# Patient Record
Sex: Male | Born: 1995 | Race: White | Hispanic: No | Marital: Single | Smoking: Never smoker
Health system: Southern US, Community
[De-identification: ages and names within clinical notes are randomized; demographics above are authoritative.]

## PROBLEM LIST (undated history)

## (undated) DIAGNOSIS — R030 Elevated blood-pressure reading, without diagnosis of hypertension: Secondary | ICD-10-CM

## (undated) DIAGNOSIS — R519 Headache, unspecified: Secondary | ICD-10-CM

## (undated) HISTORY — DX: Elevated blood-pressure reading, without diagnosis of hypertension: R03.0

## (undated) HISTORY — DX: Headache, unspecified: R51.9

---

## 2018-07-11 ENCOUNTER — Ambulatory Visit: Payer: Self-pay | Admitting: Primary Care

## 2018-09-09 ENCOUNTER — Ambulatory Visit: Payer: Self-pay | Admitting: Primary Care

## 2018-09-13 ENCOUNTER — Encounter: Payer: Self-pay | Admitting: Primary Care

## 2018-09-13 ENCOUNTER — Other Ambulatory Visit: Payer: Self-pay

## 2018-09-13 ENCOUNTER — Ambulatory Visit: Payer: BLUE CROSS/BLUE SHIELD | Admitting: Primary Care

## 2018-09-13 ENCOUNTER — Ambulatory Visit (INDEPENDENT_AMBULATORY_CARE_PROVIDER_SITE_OTHER)
Admission: RE | Admit: 2018-09-13 | Discharge: 2018-09-13 | Disposition: A | Discharge status: Home / Self Care | Payer: BLUE CROSS/BLUE SHIELD | Source: Ambulatory Visit | Attending: Primary Care | Admitting: Primary Care

## 2018-09-13 VITALS — BP 146/96 | HR 83 | Temp 98.2°F | Ht 70.25 in | Wt 233.5 lb

## 2018-09-13 DIAGNOSIS — R030 Elevated blood-pressure reading, without diagnosis of hypertension: Secondary | ICD-10-CM | POA: Diagnosis not present

## 2018-09-13 DIAGNOSIS — M79672 Pain in left foot: Secondary | ICD-10-CM

## 2018-09-13 DIAGNOSIS — R200 Anesthesia of skin: Secondary | ICD-10-CM

## 2018-09-13 DIAGNOSIS — M79673 Pain in unspecified foot: Secondary | ICD-10-CM | POA: Insufficient documentation

## 2018-09-13 DIAGNOSIS — R202 Paresthesia of skin: Secondary | ICD-10-CM

## 2018-09-13 LAB — COMPREHENSIVE METABOLIC PANEL WITH GFR
ALT: 11 U/L (ref 0–53)
AST: 18 U/L (ref 0–37)
Albumin: 4.5 g/dL (ref 3.5–5.2)
Alkaline Phosphatase: 52 U/L (ref 39–117)
BUN: 20 mg/dL (ref 6–23)
CO2: 27 meq/L (ref 19–32)
Calcium: 9.7 mg/dL (ref 8.4–10.5)
Chloride: 105 meq/L (ref 96–112)
Creatinine, Ser: 0.96 mg/dL (ref 0.40–1.50)
GFR: 98.46 mL/min
Glucose, Bld: 94 mg/dL (ref 70–99)
Potassium: 5.1 meq/L (ref 3.5–5.1)
Sodium: 141 meq/L (ref 135–145)
Total Bilirubin: 0.5 mg/dL (ref 0.2–1.2)
Total Protein: 7.4 g/dL (ref 6.0–8.3)

## 2018-09-13 LAB — URIC ACID: Uric Acid, Serum: 8.5 mg/dL — ABNORMAL HIGH (ref 4.0–7.8)

## 2018-09-13 LAB — VITAMIN B12: Vitamin B-12: 556 pg/mL (ref 211–911)

## 2018-09-13 LAB — CBC
HCT: 43.8 % (ref 39.0–52.0)
Hemoglobin: 15.5 g/dL (ref 13.0–17.0)
MCHC: 35.3 g/dL (ref 30.0–36.0)
MCV: 96.7 fl (ref 78.0–100.0)
Platelets: 286 K/uL (ref 150.0–400.0)
RBC: 4.53 Mil/uL (ref 4.22–5.81)
RDW: 12.6 % (ref 11.5–15.5)
WBC: 6 K/uL (ref 4.0–10.5)

## 2018-09-13 LAB — HEMOGLOBIN A1C: Hgb A1c MFr Bld: 5.1 % (ref 4.6–6.5)

## 2018-09-13 NOTE — Assessment & Plan Note (Signed)
Noted today, also about the same with repeat BP check. Strong family history of hypertension and diabetes.  Encouraged him to continue to work on weight loss, recommended regular exercise.  Will have him monitor BP at home and report readings at or above 135/90. We will plan to see him back for CPE in 2-3 months.

## 2018-09-13 NOTE — Patient Instructions (Signed)
Stop by the lab and xray prior to leaving today. I will notify you of your results once received.   Continue to monitor your blood pressure and notify me if you see readings at or above 135/90 consistently.  Start exercising. You should be getting 150 minutes of moderate intensity exercise weekly.  It's important to improve your diet by reducing consumption of fast food, fried food, processed snack foods, sugary drinks. Increase consumption of fresh vegetables and fruits, whole grains, water.  Ensure you are drinking 64 ounces of water daily.  Please schedule a physical with me in 3 months.  It was a pleasure to meet you today! Please don't hesitate to call or message me with any questions. Welcome to [facility]!

## 2018-09-13 NOTE — Assessment & Plan Note (Addendum)
Present for the last two months, does work on his feet for most of his work day. Numbness and tingling to left foot and great toe. Also with strong family history of diabetes with charcot foot and hypertension  Start with labs including A1C, CMP, B12, CBC.  Check plain films of left foot to rule out any other cause.  Suspect plantar fasciitis. Discussed home exercises to left foot for reduction in symptoms. Good ROM on exam. If labs and plain films unremarkable then consider podiatry evaluation.

## 2018-09-13 NOTE — Progress Notes (Signed)
Subjective:    Patient ID: Ronald Landry, male    DOB: 1996-04-20, 23 y.o.   MRN: [id number]  HPI  Ronald Landry is a 23 year old male who presents today to establish care and discuss the problems mentioned below. Will obtain/review records.  BP Readings from Last 3 Encounters:  09/13/18 (!) 146/96    1) Elevated Blood Pressure Reading: Strong family history of hypertension, hyperlipidemia, and diabetes. No prior diagnosis of hypertension. He endorses that he's ben under a lot of stress over the last one week with work.   He endorses a fair diet, limits fried food. He tries to make better choices, drinks mostly water. He endorses a weight loss of nearly 70 pounds after improving his lifestyle.   2) Foot Pain: Located to the left heel and medial side of foot down to the great toe. Daily numbness to the lateral and tip of left great toe. This began about 2 months ago. He works at [employer] and is on his feet for 7-8 hours at a time for 5-7 days weekly. He describes his discomfort as "tingling".He will be "unable to walk" sometimes after a long shift. He is wearing Brooks shoes with extra insoles. He's been taking Aleve most everyday with temporary improvement. He's been wearing a foot support brace for the last one month with some improvement.   He denies injury/trauma, erythema, bruising.   Review of Systems  Constitutional: Negative for unexpected weight change.  Respiratory: Negative for shortness of breath.   Cardiovascular: Negative for chest pain.  Musculoskeletal:       Acute left foot pain  Skin: Negative for color change.  Neurological: Positive for numbness. Negative for dizziness.       No past medical history on file.   Social History   Socioeconomic History  . Marital status: Single    Spouse name: Not on file  . Number of children: Not on file  . Years of education: Not on file  . Highest education level: Not on file  Occupational History  . Not on file  Social  Needs  . Financial resource strain: Not on file  . Food insecurity:    Worry: Not on file    Inability: Not on file  . Transportation needs:    Medical: Not on file    Non-medical: Not on file  Tobacco Use  . Smoking status: Never Smoker  . Smokeless tobacco: Never Used  Substance and Sexual Activity  . Alcohol use: Yes  . Drug use: Not on file  . Sexual activity: Not on file  Lifestyle  . Physical activity:    Days per week: Not on file    Minutes per session: Not on file  . Stress: Not on file  Relationships  . Social connections:    Talks on phone: Not on file    Gets together: Not on file    Attends religious service: Not on file    Active member of club or organization: Not on file    Attends meetings of clubs or organizations: Not on file    Relationship status: Not on file  . Intimate partner violence:    Fear of current or ex partner: Not on file    Emotionally abused: Not on file    Physically abused: Not on file    Forced sexual activity: Not on file  Other Topics Concern  . Not on file  Social History Narrative  . Not on file  Family History  Problem Relation Age of Onset  . Hyperlipidemia Mother   . Hypertension Mother   . Diabetes Father   . Hyperlipidemia Father   . Hypertension Father   . Diabetes Paternal Grandmother   . Hypertension Paternal Grandmother   . Diabetes Paternal Grandfather   . Hypertension Paternal Grandfather     No Known Allergies  No current outpatient medications on file prior to visit.   No current facility-administered medications on file prior to visit.     BP (!) 146/96   Pulse 83   Temp 98.2 F (36.8 C) (Oral)   Ht 5' 10.25" (1.784 m)   Wt 233 lb 8 oz (105.9 kg)   SpO2 98%   BMI 33.27 kg/m    Objective:   Physical Exam  Constitutional: He appears well-nourished.  Neck: Neck supple.  Cardiovascular: Normal rate and regular rhythm.  Pulses:      Dorsalis pedis pulses are 2+ on the left side.        Posterior tibial pulses are 2+ on the left side.  Respiratory: Effort normal and breath sounds normal.  Musculoskeletal:     Left foot: Normal range of motion. No tenderness, bony tenderness or swelling.       Feet:  Skin: Skin is warm and dry. No erythema.  Psychiatric: He has a normal mood and affect.           Assessment & Plan:

## 2018-09-14 ENCOUNTER — Encounter: Payer: Self-pay | Admitting: *Deleted

## 2018-09-14 ENCOUNTER — Other Ambulatory Visit: Payer: Self-pay | Admitting: Primary Care

## 2018-09-14 DIAGNOSIS — M109 Gout, unspecified: Secondary | ICD-10-CM

## 2018-09-14 MED ORDER — PREDNISONE 20 MG PO TABS: ORAL_TABLET | ORAL | 0 refills | Status: DC

## 2018-12-14 ENCOUNTER — Encounter: Payer: BLUE CROSS/BLUE SHIELD | Admitting: Primary Care

## 2019-04-25 ENCOUNTER — Encounter: Payer: Self-pay | Admitting: Primary Care

## 2019-04-25 ENCOUNTER — Ambulatory Visit: Payer: BC Managed Care – PPO | Admitting: Primary Care

## 2019-04-25 ENCOUNTER — Ambulatory Visit (INDEPENDENT_AMBULATORY_CARE_PROVIDER_SITE_OTHER)
Admission: RE | Admit: 2019-04-25 | Discharge: 2019-04-25 | Disposition: A | Discharge status: Home / Self Care | Payer: BC Managed Care – PPO | Source: Ambulatory Visit | Attending: Primary Care | Admitting: Primary Care

## 2019-04-25 ENCOUNTER — Other Ambulatory Visit: Payer: Self-pay

## 2019-04-25 VITALS — BP 136/86 | HR 104 | Temp 96.6°F | Ht 70.25 in | Wt 208.2 lb

## 2019-04-25 DIAGNOSIS — G8929 Other chronic pain: Secondary | ICD-10-CM | POA: Diagnosis not present

## 2019-04-25 DIAGNOSIS — M79672 Pain in left foot: Secondary | ICD-10-CM

## 2019-04-25 DIAGNOSIS — Z23 Encounter for immunization: Secondary | ICD-10-CM

## 2019-04-25 LAB — CBC WITH DIFFERENTIAL/PLATELET
Basophils Absolute: 0.1 10*3/uL (ref 0.0–0.1)
Basophils Relative: 1.1 % (ref 0.0–3.0)
Eosinophils Absolute: 0.2 10*3/uL (ref 0.0–0.7)
Eosinophils Relative: 3.6 % (ref 0.0–5.0)
HCT: 44.3 % (ref 39.0–52.0)
Hemoglobin: 15.1 g/dL (ref 13.0–17.0)
Lymphocytes Relative: 28.8 % (ref 12.0–46.0)
Lymphs Abs: 1.8 10*3/uL (ref 0.7–4.0)
MCHC: 34.2 g/dL (ref 30.0–36.0)
MCV: 96.6 fl (ref 78.0–100.0)
Monocytes Absolute: 0.4 10*3/uL (ref 0.1–1.0)
Monocytes Relative: 5.9 % (ref 3.0–12.0)
Neutro Abs: 3.7 10*3/uL (ref 1.4–7.7)
Neutrophils Relative %: 60.6 % (ref 43.0–77.0)
Platelets: 315 10*3/uL (ref 150.0–400.0)
RBC: 4.59 Mil/uL (ref 4.22–5.81)
RDW: 12.7 % (ref 11.5–15.5)
WBC: 6.1 10*3/uL (ref 4.0–10.5)

## 2019-04-25 LAB — URIC ACID: Uric Acid, Serum: 7.2 mg/dL (ref 4.0–7.8)

## 2019-04-25 NOTE — Assessment & Plan Note (Signed)
Little improvement after prednisone treatment last year, continued discomfort with weight bearing.   Repeat labs and plain films. Discussed to continue to wear comfortable shoes, add in insoles.   Referral placed to podiatry for evaluation given chronic symptoms.

## 2019-04-25 NOTE — Progress Notes (Signed)
Subjective:    Patient ID: Ronald Landry, male    DOB: 1995-10-29, 24 y.o.   MRN: [id number]  HPI  Mr. Ronald Landry is a 24 year old male with a history of acute foot pain who presents today with a chief complaint of foot pain.  He was initially evaluated for this in May 2020 for complaints of left heel and medial foot pain down to the great toe. Xray completed which showed early spurring at 1st MTP joint with maintained spaces. Labs showed elevated Uric Acid level so he was treated with prednisone course. Other labs grossly unremarkable.   BP Readings from Last 3 Encounters:  04/25/19 136/86  09/13/18 (!) 146/96   Today he endorses left medial foot pain beginning at the arch and radiating to the left medial ankle and occasional radiation up to left knee. He works at [employer] and is on his feet for 10 hours, four to five days weekly. After treatment in May 2020 he had some improvement but without resolve.   He endorses wearing more comfortable shoes at work over the last week which has helped some. He's tried adding extra insoles to his shoes in the past. He's taking Aleve, Motrin, Tylenol without improvement.   Review of Systems  Constitutional: Negative for fever.  Musculoskeletal: Positive for arthralgias and myalgias.  Skin: Negative for color change.       Past Medical History:  Diagnosis Date  . Elevated blood pressure reading   . Frequent headaches      Social History   Socioeconomic History  . Marital status: Single    Spouse name: Not on file  . Number of children: Not on file  . Years of education: Not on file  . Highest education level: Not on file  Occupational History  . Not on file  Tobacco Use  . Smoking status: Never Smoker  . Smokeless tobacco: Never Used  Substance and Sexual Activity  . Alcohol use: Yes  . Drug use: Not on file  . Sexual activity: Not on file  Other Topics Concern  . Not on file  Social History Narrative  . Not on file   Social  Determinants of Health   Financial Resource Strain:   . Difficulty of Paying Living Expenses: Not on file  Food Insecurity:   . Worried About Programme researcher, broadcasting/film/video in the Last Year: Not on file  . Ran Out of Food in the Last Year: Not on file  Transportation Needs:   . Lack of Transportation (Medical): Not on file  . Lack of Transportation (Non-Medical): Not on file  Physical Activity:   . Days of Exercise per Week: Not on file  . Minutes of Exercise per Session: Not on file  Stress:   . Feeling of Stress : Not on file  Social Connections:   . Frequency of Communication with Friends and Family: Not on file  . Frequency of Social Gatherings with Friends and Family: Not on file  . Attends Religious Services: Not on file  . Active Member of Clubs or Organizations: Not on file  . Attends Banker Meetings: Not on file  . Marital Status: Not on file  Intimate Partner Violence:   . Fear of Current or Ex-Partner: Not on file  . Emotionally Abused: Not on file  . Physically Abused: Not on file  . Sexually Abused: Not on file    No past surgical history on file.  Family History  Problem Relation Age of  Onset  . Hyperlipidemia Mother   . Hypertension Mother   . Diabetes Father   . Hyperlipidemia Father   . Hypertension Father   . Diabetes Paternal Grandmother   . Hypertension Paternal Grandmother   . Diabetes Paternal Grandfather   . Hypertension Paternal Grandfather     No Known Allergies  No current outpatient medications on file prior to visit.   No current facility-administered medications on file prior to visit.    BP 136/86   Pulse (!) 104   Temp (!) 96.6 F (35.9 C) (Temporal)   Ht 5' 10.25" (1.784 m)   Wt 208 lb 4 oz (94.5 kg)   SpO2 98%   BMI 29.67 kg/m    Objective:   Physical Exam  Constitutional: He appears well-nourished.  Respiratory: Effort normal.  Musculoskeletal:     Left foot: Normal range of motion. No deformity, tenderness or  bony tenderness.     Comments: Flat feet with minimal arch  Skin: Skin is warm and dry. No erythema.           Assessment & Plan:

## 2019-04-25 NOTE — Addendum Note (Signed)
Addended by: Tawnya Crook on: 04/25/2019 12:38 PM   Modules accepted: Orders

## 2019-04-25 NOTE — Patient Instructions (Addendum)
Stop by the lab and xray prior to leaving today. I will notify you of your results once received.   You will be contacted regarding your referral to podiatry.  Please let us know if you have not been contacted within two weeks.   Continue to wear comfortable, supportive shoes. Add in additional insoles for support.   It was a pleasure to see you today!

## 2019-05-02 ENCOUNTER — Ambulatory Visit (INDEPENDENT_AMBULATORY_CARE_PROVIDER_SITE_OTHER): Payer: BC Managed Care – PPO | Admitting: Podiatry

## 2019-05-02 ENCOUNTER — Other Ambulatory Visit: Payer: Self-pay

## 2019-05-02 ENCOUNTER — Ambulatory Visit: Payer: BC Managed Care – PPO

## 2019-05-02 ENCOUNTER — Other Ambulatory Visit: Payer: Self-pay | Admitting: Podiatry

## 2019-05-02 ENCOUNTER — Ambulatory Visit (INDEPENDENT_AMBULATORY_CARE_PROVIDER_SITE_OTHER): Payer: BC Managed Care – PPO

## 2019-05-02 DIAGNOSIS — M79672 Pain in left foot: Secondary | ICD-10-CM

## 2019-05-02 DIAGNOSIS — M722 Plantar fascial fibromatosis: Secondary | ICD-10-CM

## 2019-05-04 ENCOUNTER — Encounter: Payer: Self-pay | Admitting: Podiatry

## 2019-05-04 NOTE — Progress Notes (Signed)
  Subjective:  Patient ID: Ronald Landry, male    DOB: Aug 13, 1995,  MRN: [id number]  Chief Complaint  Patient presents with  . Foot Pain    pt is here for left foot pain, located on the medial side of the left foot, pain has been going on for about 2 years, pt states that pain is elevated when he is at work, pt states that the pain at times can be on a 10 out of 10 scale    24 y.o. male presents with the above complaint.  Patient presents with left heel pain.  Patient states the pain started about 2 years ago and has progressively gotten worse.  Patient states the pain is right in the arch of her foot.  He states he does have a history of gout and is managed with medication.  Pain is elevated when walking.  Patient has tried Epson salt but has not helped.  Patient also works as she eats and states that he walks around a lot that prevents this from properly healing.  Patient states that his pain is 10 out of 10 today.  He denies any other acute complaints.  He denies seeing anyone else for this.   Review of Systems: Negative except as noted in the HPI. Denies N/V/F/Ch.  Past Medical History:  Diagnosis Date  . Elevated blood pressure reading   . Frequent headaches    No current outpatient medications on file.  Social History   Tobacco Use  Smoking Status Never Smoker  Smokeless Tobacco Never Used    No Known Allergies Objective:  There were no vitals filed for this visit. There is no height or weight on file to calculate BMI. Constitutional Well developed. Well nourished.  Vascular Dorsalis pedis pulses palpable bilaterally. Posterior tibial pulses palpable bilaterally. Capillary refill normal to all digits.  No cyanosis or clubbing noted. Pedal hair growth normal.  Neurologic Normal speech. Oriented to person, place, and time. Epicritic sensation to light touch grossly present bilaterally.  Dermatologic Nails well groomed and normal in appearance. No open wounds. No skin  lesions.  Orthopedic: Normal joint ROM without pain or crepitus bilaterally. No visible deformities. No pain on palpation to the calcaneal tuber.  However pain on palpation to the central band of the plantar fascia located centrally as opposed to to the heel. No pain with calcaneal squeeze left. Ankle ROM full range of motion bilaterally. Silfverskiold Test: negative bilaterally.   Radiographs: Taken and reviewed. No acute fractures or dislocations. No evidence of stress fracture.  Plantar heel spur absent. Posterior heel spur absent.  Assessment:   1. Foot pain, left   2. Plantar fasciitis of left foot    Plan:  Patient was evaluated and treated and all questions answered.  Plantar Fasciitis, left central band located centrally - XR reviewed as above.  - Educated on icing and stretching. Instructions given.  - Injection delivered to the plantar fascia as below. - DME: Plantar Fascial Brace - Pharmacologic management: None  Procedure: Injection Tendon/Ligament Location: Left plantar fascia at the glabrous junction; medial approach. Skin Prep: alcohol Injectate: 0.5 cc 0.5% marcaine plain, 0.5 cc of 1% Lidocaine, 0.5 cc kenalog 10. Disposition: Patient tolerated procedure well. Injection site dressed with a band-aid.  Return in about 4 weeks (around 05/30/2019).

## 2019-05-30 ENCOUNTER — Ambulatory Visit: Payer: BC Managed Care – PPO | Admitting: Podiatry

## 2019-06-12 ENCOUNTER — Ambulatory Visit: Payer: BC Managed Care – PPO | Admitting: Podiatry

## 2019-06-13 ENCOUNTER — Ambulatory Visit: Payer: BC Managed Care – PPO | Admitting: Podiatry

## 2019-06-15 DIAGNOSIS — Z03818 Encounter for observation for suspected exposure to other biological agents ruled out: Secondary | ICD-10-CM | POA: Diagnosis not present

## 2019-06-15 DIAGNOSIS — Z20828 Contact with and (suspected) exposure to other viral communicable diseases: Secondary | ICD-10-CM | POA: Diagnosis not present

## 2019-06-20 ENCOUNTER — Other Ambulatory Visit: Payer: Self-pay

## 2019-06-20 ENCOUNTER — Ambulatory Visit: Payer: BC Managed Care – PPO | Admitting: Podiatry

## 2019-06-20 DIAGNOSIS — M722 Plantar fascial fibromatosis: Secondary | ICD-10-CM

## 2019-06-20 DIAGNOSIS — M79672 Pain in left foot: Secondary | ICD-10-CM

## 2019-06-20 DIAGNOSIS — M2141 Flat foot [pes planus] (acquired), right foot: Secondary | ICD-10-CM | POA: Diagnosis not present

## 2019-06-20 DIAGNOSIS — M2142 Flat foot [pes planus] (acquired), left foot: Secondary | ICD-10-CM | POA: Diagnosis not present

## 2019-06-21 ENCOUNTER — Encounter: Payer: Self-pay | Admitting: Podiatry

## 2019-06-21 NOTE — Progress Notes (Signed)
  Subjective:  Patient ID: Ronald Landry, male    DOB: 12-04-1995,  MRN: [id number]  Chief Complaint  Patient presents with  . Plantar Fasciitis    pt is here for a 4 week plantar fasciitis f/u, pt states that he is feeling better butis still in pain, since the last time he was here, pt puts pain scale as a 6 out of 10.    24 y.o. male presents with the above complaint.  Patient presents with a follow-up of left plantar fasciitis.  Patient states is getting better.  The injection helped.  The bracing also helped.  He states his been doing stretching exercises which has also been helping.  His pain scale is now 6 out of 10.  The pain is decreasing from the first time he had something.  He denies any other acute complaints.  He has also made shoe gear modification as well.   Review of Systems: Negative except as noted in the HPI. Denies N/V/F/Ch.  Past Medical History:  Diagnosis Date  . Elevated blood pressure reading   . Frequent headaches    No current outpatient medications on file.  Social History   Tobacco Use  Smoking Status Never Smoker  Smokeless Tobacco Never Used    No Known Allergies Objective:  There were no vitals filed for this visit. There is no height or weight on file to calculate BMI. Constitutional Well developed. Well nourished.  Vascular Dorsalis pedis pulses palpable bilaterally. Posterior tibial pulses palpable bilaterally. Capillary refill normal to all digits.  No cyanosis or clubbing noted. Pedal hair growth normal.  Neurologic Normal speech. Oriented to person, place, and time. Epicritic sensation to light touch grossly present bilaterally.  Dermatologic Nails well groomed and normal in appearance. No open wounds. No skin lesions.  Orthopedic: Normal joint ROM without pain or crepitus bilaterally. No visible deformities. No pain on palpation to the calcaneal tuber.  However pain on palpation to the central band of the plantar fascia located  centrally as opposed to to the heel. No pain with calcaneal squeeze left. Ankle ROM full range of motion bilaterally. Silfverskiold Test: negative bilaterally.   Radiographs: None  Assessment:   No diagnosis found. Plan:  Patient was evaluated and treated and all questions answered.  Plantar Fasciitis, left central band located centrally - XR reviewed as above.  - Educated on icing and stretching. Instructions given.  - Injection delivered to the plantar fascia as below. - DME: Night splint - Pharmacologic management: None  Pes planus semiflexible -I explained to the patient the etiology of pes planus with this relationship with a plantar fasciitis.  I believe patient will benefit from custom-made orthotics to help support the arches of the foot as well as control the hindfoot motion and therefore take the stress off of the plantar fascia.  Patient will be scheduled to see rec/bed for custom-made orthotics.  Procedure: Injection Tendon/Ligament Location: Left plantar fascia at the glabrous junction; medial approach. Skin Prep: alcohol Injectate: 0.5 cc 0.5% marcaine plain, 0.5 cc of 1% Lidocaine, 0.5 cc kenalog 10. Disposition: Patient tolerated procedure well. Injection site dressed with a band-aid.  No follow-ups on file.

## 2019-07-05 ENCOUNTER — Other Ambulatory Visit: Payer: BC Managed Care – PPO | Admitting: Orthotics

## 2019-07-11 ENCOUNTER — Other Ambulatory Visit: Payer: BC Managed Care – PPO | Admitting: Orthotics

## 2019-07-18 ENCOUNTER — Ambulatory Visit: Payer: BC Managed Care – PPO | Admitting: Podiatry

## 2019-07-18 ENCOUNTER — Other Ambulatory Visit: Payer: Self-pay

## 2019-07-18 DIAGNOSIS — M2141 Flat foot [pes planus] (acquired), right foot: Secondary | ICD-10-CM

## 2019-07-18 DIAGNOSIS — M2142 Flat foot [pes planus] (acquired), left foot: Secondary | ICD-10-CM

## 2019-07-18 DIAGNOSIS — M79672 Pain in left foot: Secondary | ICD-10-CM | POA: Diagnosis not present

## 2019-07-18 DIAGNOSIS — M722 Plantar fascial fibromatosis: Secondary | ICD-10-CM

## 2019-07-19 ENCOUNTER — Encounter: Payer: Self-pay | Admitting: Podiatry

## 2019-07-19 NOTE — Progress Notes (Signed)
  Subjective:  Patient ID: Ronald Landry, male    DOB: September 27, 1995,  MRN: [id number]  Chief Complaint  Patient presents with  . Plantar Fasciitis    pt is here for a f/u of plantar fasciitis of the left foot. Pt states that he is feeling a lot better. Pt also states that he feels no pain when wearing the brace. Pt has no other comments or concerns.    24 y.o. male presents with the above complaint.  Patient presents with a follow-up of left plantar fasciitis.  Patient states the injection and bracing tremendously helped.  He does not have pain anymore more.  He states he does not feel when wearing the brace.  He would like to discuss getting orthotics and more of long-term management.  Overall he is pain has completely resolved.   Review of Systems: Negative except as noted in the HPI. Denies N/V/F/Ch.  Past Medical History:  Diagnosis Date  . Elevated blood pressure reading   . Frequent headaches    No current outpatient medications on file.  Social History   Tobacco Use  Smoking Status Never Smoker  Smokeless Tobacco Never Used    No Known Allergies Objective:  There were no vitals filed for this visit. There is no height or weight on file to calculate BMI. Constitutional Well developed. Well nourished.  Vascular Dorsalis pedis pulses palpable bilaterally. Posterior tibial pulses palpable bilaterally. Capillary refill normal to all digits.  No cyanosis or clubbing noted. Pedal hair growth normal.  Neurologic Normal speech. Oriented to person, place, and time. Epicritic sensation to light touch grossly present bilaterally.  Dermatologic Nails well groomed and normal in appearance. No open wounds. No skin lesions.  Orthopedic: Normal joint ROM without pain or crepitus bilaterally. No visible deformities. No pain on palpation to the calcaneal tuber.   No pain with calcaneal squeeze left. Ankle ROM full range of motion bilaterally. Silfverskiold Test: negative  bilaterally.   Radiographs: None  Assessment:   1. Plantar fasciitis of left foot   2. Foot pain, left   3. Pes planus of both feet    Plan:  Patient was evaluated and treated and all questions answered.  Plantar Fasciitis, left central band located centrally -Resolved.  Patient has been wearing his plantar fascial brace.  He has also been wearing his night brace as well which is helping a little bit. -No further injections are indicated has as his pain completely resolved. -I discussed with the patient on long-term management of plantar fasciitis which includes stretching exercises as well as custom-made orthotics.  Patient will plan to get custom-made orthotics. -He is officially discharged from my care.  I have asked the patient that if any foot and ankle issues arise in the future come back and see me.  Patient states understanding  Pes planus semiflexible -I explained to the patient the etiology of pes planus with this relationship with a plantar fasciitis.  I believe patient will benefit from custom-made orthotics to help support the arches of the foot as well as control the hindfoot motion and therefore take the stress off of the plantar fascia.  Patient will be scheduled to see rec/bed for custom-made orthotics.   No follow-ups on file.

## 2019-07-20 ENCOUNTER — Other Ambulatory Visit: Payer: BC Managed Care – PPO | Admitting: Orthotics

## 2019-07-31 DIAGNOSIS — Z20828 Contact with and (suspected) exposure to other viral communicable diseases: Secondary | ICD-10-CM | POA: Diagnosis not present

## 2019-07-31 DIAGNOSIS — U071 COVID-19: Secondary | ICD-10-CM | POA: Diagnosis not present

## 2019-08-08 ENCOUNTER — Ambulatory Visit (INDEPENDENT_AMBULATORY_CARE_PROVIDER_SITE_OTHER): Payer: BC Managed Care – PPO | Admitting: Orthotics

## 2019-08-08 ENCOUNTER — Other Ambulatory Visit: Payer: Self-pay

## 2019-08-08 DIAGNOSIS — M722 Plantar fascial fibromatosis: Secondary | ICD-10-CM

## 2019-08-08 DIAGNOSIS — M2141 Flat foot [pes planus] (acquired), right foot: Secondary | ICD-10-CM

## 2019-08-08 DIAGNOSIS — M2142 Flat foot [pes planus] (acquired), left foot: Secondary | ICD-10-CM

## 2019-08-08 DIAGNOSIS — M79672 Pain in left foot: Secondary | ICD-10-CM

## 2019-08-29 ENCOUNTER — Ambulatory Visit: Payer: BC Managed Care – PPO | Admitting: Orthotics

## 2019-08-29 ENCOUNTER — Other Ambulatory Visit: Payer: Self-pay

## 2019-08-29 DIAGNOSIS — M722 Plantar fascial fibromatosis: Secondary | ICD-10-CM

## 2019-08-29 NOTE — Progress Notes (Signed)
Patient came in today to pick up custom made foot orthotics.  The goals were accomplished and the patient reported no dissatisfaction with said orthotics.  Patient was advised of breakin period and how to report any issues. 

## 2020-07-04 ENCOUNTER — Encounter: Payer: Self-pay | Admitting: Primary Care

## 2020-07-04 ENCOUNTER — Other Ambulatory Visit: Payer: Self-pay

## 2020-07-04 ENCOUNTER — Ambulatory Visit: Payer: BC Managed Care – PPO | Admitting: Primary Care

## 2020-07-04 VITALS — BP 134/82 | HR 118 | Temp 98.6°F | Ht 70.25 in | Wt 193.0 lb

## 2020-07-04 DIAGNOSIS — M79605 Pain in left leg: Secondary | ICD-10-CM

## 2020-07-04 MED ORDER — CYCLOBENZAPRINE HCL 5 MG PO TABS: 5.0000 mg | ORAL_TABLET | Freq: Every evening | ORAL | 0 refills | Status: DC | PRN

## 2020-07-04 MED ORDER — PREDNISONE 20 MG PO TABS: ORAL_TABLET | ORAL | 0 refills | Status: DC

## 2020-07-04 MED ORDER — METHYLPREDNISOLONE ACETATE 80 MG/ML IJ SUSP
80.0000 mg | Freq: Once | INTRAMUSCULAR | Status: AC
  Administered 2020-07-04: 80 mg via INTRAMUSCULAR

## 2020-07-04 NOTE — Patient Instructions (Signed)
Start prednisone tablets tomorrow morning. Take two tablets my mouth once daily for four days, then one tablet once daily for four days.   You may take the cyclobenzaprine 5 mg tablets twice daily as needed for muscle spasms, be careful as this may cause drowsiness. You may want to start at bedtime.   Try to stretch the leg muscle when able, this will prevent further stiffness.   It was a pleasure to see you today!

## 2020-07-04 NOTE — Progress Notes (Signed)
Subjective:    Patient ID: Ronald Landry, male    DOB: [birth year], 25 y.o.   MRN: [id number]  HPI  Ronald Landry is a very pleasant 25 y.o. male who presents today with a chief complaint of lower extremity pain.  His pain is located to the left lower buttocks, anterior and posterior upper portion of his left lower extremity, proximal to the knee.   His pain began about 2 months ago, worse over the last 2 weeks. He describes his pain as tightness, "feels like someone is grasping". He does notice tingling at times. Pain occurs at rest, when trying to sleep at night, laying down, walking.   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 He denies known injury/trauma, lower back pain, right lower extremity pain. 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    Review of Systems  Musculoskeletal: Positive for arthralgias and myalgias. Negative for back pain.  Neurological: Positive for numbness. Negative for weakness.         Past Medical History:  Diagnosis Date  . Elevated blood pressure reading   . Frequent headaches     Social History   Socioeconomic History  . Marital status: Single    Spouse name: Not on file  . Number of children: Not on file  . Years of education: Not on file  . Highest education level: Not on file  Occupational History  . Not on file  Tobacco Use  . Smoking status: Never Smoker  . Smokeless tobacco: Never Used  Substance and Sexual Activity  . Alcohol use: Yes  . Drug use: Not on file  . Sexual activity: Not on file  Other Topics Concern  . Not on file  Social History Narrative  . Not on file   Social Determinants of Health   Financial Resource Strain: Not on file  Food Insecurity: Not on file  Transportation Needs: Not on file  Physical Activity: Not on file  Stress: Not on file  Social Connections: Not on file  Intimate Partner  Violence: Not on file    History reviewed. No pertinent surgical history.  Family History  Problem Relation Age of Onset  . Hyperlipidemia Mother   . Hypertension Mother   . Diabetes Father   . Hyperlipidemia Father   . Hypertension Father   . Diabetes Paternal Grandmother   . Hypertension Paternal Grandmother   . Diabetes Paternal Grandfather   . Hypertension Paternal Grandfather     No Known Allergies  No current outpatient medications on file prior to visit.   No current facility-administered medications on file prior to visit.    BP 134/82   Pulse (!) 118   Temp 98.6 F (37 C) (Temporal)   Ht 5' 10.25" (1.784 m)   Wt 193 lb (87.5 kg)   SpO2 98%   BMI 27.50 kg/m  Objective:   Physical Exam Musculoskeletal:     Lumbar back: No bony tenderness. Normal range of motion.       Back:     Comments: Unable to straighten left lower extremity when in a seated position on exam table due to pain. Pain to left buttock and lower extremity when laying supine on exam table. Difficulty getting up and down from exam table.  5/5 strength to bilateral lower extremities when sitting on exam tablet.   Skin:    General: Skin is warm and dry.  Neurological:     Mental Status: He is alert.           Assessment & Plan:      This visit occurred during the SARS-CoV-2 public health emergency.  Safety protocols were in place, including screening questions prior to the visit, additional usage of staff PPE, and extensive cleaning of exam room while observing appropriate contact time as indicated for disinfecting solutions.

## 2020-07-04 NOTE — Assessment & Plan Note (Signed)
Could be piriformis syndrome with sciatica. He does appear uncomfortable on exam today, no alarm signs.   Start with IM Depo Medrol 80 mg today. Rx for prednisone 20 mg course sent to pharmacy.  Rx for flexeril 5 mg sent to pharmacy.   Discussed stretching, walking. He will update early next week if no improvement. Consider Physical therapy at that point.   Work note provided for [employer] and tomorrow.

## 2020-07-30 ENCOUNTER — Encounter: Payer: Self-pay | Admitting: Family Medicine

## 2020-07-30 ENCOUNTER — Telehealth: Payer: BC Managed Care – PPO | Admitting: Family Medicine

## 2020-07-30 VITALS — Temp 102.0°F | Wt 193.0 lb

## 2020-07-30 DIAGNOSIS — J029 Acute pharyngitis, unspecified: Secondary | ICD-10-CM | POA: Diagnosis not present

## 2020-07-30 DIAGNOSIS — M79605 Pain in left leg: Secondary | ICD-10-CM | POA: Diagnosis not present

## 2020-07-30 MED ORDER — CEPHALEXIN 500 MG PO CAPS: 500.0000 mg | ORAL_CAPSULE | Freq: Three times a day (TID) | ORAL | 0 refills | Status: AC

## 2020-07-30 NOTE — Progress Notes (Signed)
Subjective:    Patient ID: Ronald Landry, male    DOB: 08/22/1995, 25 y.o.   MRN: [id number]  HPI Virtual Visit via Video Note  I connected with the patient on 07/30/20 at  2:15 PM EDT by a video enabled telemedicine application and verified that I am speaking with the correct person using two identifiers.  Location patient: home Location provider:work or home office Persons participating in the virtual visit: patient, provider  I discussed the limitations of evaluation and management by telemedicine and the availability of in person appointments. The patient expressed understanding and agreed to proceed.   HPI: Here for 4 days of what he thinks is a "strep throat". He got these a lot when he was younger. He started off with a ST that got progressively worse over 48 hours. He also had a fever to 102 degrees, but this has settled down to 99.6 degrees today. He feels lightheaded and nauseated, but he has not vomited. No body aches or diarrhea. No cough or SOB. He tested negative for the Covid virus yesterday at home. He feels some relief by taking Ibuprofen. He is drinking plenty of fluids. Today the ST has localized more to the left side. He also asks about pain in the left buttock and left upper leg. This started 3 months ago. No hx of trauma. He saw her PCP, Vernona Rieger NP, on 07-04-20 for this and she treated him with Prednisone and Flexeril. This has not helped much.    ROS: See pertinent positives and negatives per HPI.  Past Medical History:  Diagnosis Date  . Elevated blood pressure reading   . Frequent headaches     History reviewed. No pertinent surgical history.  Family History  Problem Relation Age of Onset  . Hyperlipidemia Mother   . Hypertension Mother   . Diabetes Father   . Hyperlipidemia Father   . Hypertension Father   . Diabetes Paternal Grandmother   . Hypertension Paternal Grandmother   . Diabetes Paternal Grandfather   . Hypertension Paternal  Grandfather      Current Outpatient Medications:  .  cephALEXin (KEFLEX) 500 MG capsule, Take 1 capsule (500 mg total) by mouth 3 (three) times daily for 10 days., Disp: 30 capsule, Rfl: 0 .  cyclobenzaprine (FLEXERIL) 5 MG tablet, Take 1 tablet (5 mg total) by mouth at bedtime as needed for muscle spasms., Disp: 14 tablet, Rfl: 0 .  predniSONE (DELTASONE) 20 MG tablet, Take 2 tablets by mouth once daily for four days then 1 tablet daily for four days., Disp: 12 tablet, Rfl: 0  EXAM:  VITALS per patient if applicable:  GENERAL: alert, oriented, appears well and in no acute distress  HEENT: atraumatic, conjunttiva clear, no obvious abnormalities on inspection of external nose and ears  NECK: normal movements of the head and neck  LUNGS: on inspection no signs of respiratory distress, breathing rate appears normal, no obvious gross SOB, gasping or wheezing  CV: no obvious cyanosis  MS: moves all visible extremities without noticeable abnormality  PSYCH/NEURO: pleasant and cooperative, no obvious depression or anxiety, speech and thought processing grossly intact  ASSESSMENT AND PLAN: He has a pharyngitis and possibly a tonsillitis. We will treat this with Keflex for 10 days. Continue the fluids and Ibuprofen as needed. As for the leg pain, I advised him to follow up with Vernona Rieger.  Gershon Crane, MD  Discussed the following assessment and plan:  No diagnosis found.     I discussed  the assessment and treatment plan with the patient. The patient was provided an opportunity to ask questions and all were answered. The patient agreed with the plan and demonstrated an understanding of the instructions.   The patient was advised to call back or seek an in-person evaluation if the symptoms worsen or if the condition fails to improve as anticipated.     Review of Systems     Objective:   Physical Exam        Assessment & Plan:

## 2020-07-31 ENCOUNTER — Telehealth: Payer: Self-pay | Admitting: Primary Care

## 2020-07-31 NOTE — Telephone Encounter (Signed)
The patient is needing a work note for 07/31/2020 or when you suggest for him to go back to work. He is still running a fever of 100.2 today.  His job won't let him come back to work until he is fever free for 24-48 hours.

## 2020-07-31 NOTE — Telephone Encounter (Signed)
Please give him a note for the rest of this week. Plan to go back to work on Monday

## 2020-07-31 NOTE — Telephone Encounter (Signed)
Ok for work note? 

## 2020-08-01 ENCOUNTER — Encounter: Payer: Self-pay | Admitting: Family Medicine

## 2020-08-01 NOTE — Telephone Encounter (Signed)
Pt letter was sent to pt through MyChart

## 2020-08-01 NOTE — Telephone Encounter (Signed)
Work note sent to pt MyChart portal, pt is aware

## 2020-08-28 ENCOUNTER — Other Ambulatory Visit: Payer: Self-pay

## 2020-08-28 ENCOUNTER — Ambulatory Visit: Payer: BC Managed Care – PPO | Admitting: Family Medicine

## 2020-08-28 ENCOUNTER — Ambulatory Visit (INDEPENDENT_AMBULATORY_CARE_PROVIDER_SITE_OTHER)
Admission: RE | Admit: 2020-08-28 | Discharge: 2020-08-28 | Disposition: A | Discharge status: Home / Self Care | Payer: BC Managed Care – PPO | Source: Ambulatory Visit | Attending: Family Medicine | Admitting: Family Medicine

## 2020-08-28 ENCOUNTER — Encounter: Payer: Self-pay | Admitting: Family Medicine

## 2020-08-28 VITALS — BP 142/84 | HR 82 | Temp 97.6°F | Ht 70.25 in | Wt 192.0 lb

## 2020-08-28 DIAGNOSIS — M25552 Pain in left hip: Secondary | ICD-10-CM

## 2020-08-28 DIAGNOSIS — M545 Low back pain, unspecified: Secondary | ICD-10-CM

## 2020-08-28 DIAGNOSIS — M25551 Pain in right hip: Secondary | ICD-10-CM | POA: Diagnosis not present

## 2020-08-28 DIAGNOSIS — R29898 Other symptoms and signs involving the musculoskeletal system: Secondary | ICD-10-CM

## 2020-08-28 MED ORDER — PREDNISONE 20 MG PO TABS: ORAL_TABLET | ORAL | 0 refills | Status: DC

## 2020-08-28 NOTE — Progress Notes (Signed)
Ronald Landry T. Tripp Goins, MD, CAQ Sports Medicine  Primary Care and Sports Medicine [facility] at [facility] [address] [place], [address]  Phone: [phone]  FAX: [phone]  Ronald Landry - 25 y.o. male  MRN [id number]  Date of Birth: February 11, 1996  Date: 08/28/2020  PCP: Doreene Nest, NP  Referral: Doreene Nest, NP  Chief Complaint  Patient presents with  . Hip Pain    Left, possible sciatica pain      This visit occurred during the SARS-CoV-2 public health emergency.  Safety protocols were in place, including screening questions prior to the visit, additional usage of staff PPE, and extensive cleaning of exam room while observing appropriate contact time as indicated for disinfecting solutions.   Subjective:   Ronald Landry is a 25 y.o. very pleasant male patient with Body mass index is 27.35 kg/m. who presents with the following:  This young gentleman has been having left-sided hip pain with some back pain and radiculopathy ongoing for about the last 6 months, but it has been escalating.  He has not had any form of injury that he can recall.  At this point he is having difficulty even sitting down, he is standing in the examination room today.  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  Generally, he is not that physically active. Previously, he has been doing some home stretches, and got 8 days of prednisone as well as  He some diffuse weakness, which is notably different compared to my partner's exam on 07/04/2020.  He has not had any falls or specific injuries.   No bowel or bladder incontinence.   L medial calf Entire foot - dorsum, lateral, medial, webspace all on the L  Minimal ext or straightening - beyond 20-30 deg causes focal back pain without radiation  2/5 r flexion Adduction 2/5 on the r 2/5 flexion on L 2/5 add on the L  Exceptionally poor flexibility   [name], NP:  07/04/2020 His pain is located to the left lower buttocks, anterior and posterior upper portion of his left lower extremity, proximal to the knee.   His pain began about 2 months ago, worse over the last 2 weeks. He describes his pain as tightness, "feels like someone is grasping". He does notice tingling at times. Pain occurs at rest, when trying to sleep at night, laying down, walking.   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 He denies known injury/trauma, lower back pain, right lower extremity pain. 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 Lateral and rotational movements with mild restriction, but minimal pain.  No echymosis or edema Rises to examination table with moderate difficulty Gait: notably antalgic Inspection/Deformity: N  Paraspinus Tenderness: diffuse L1-S1, greater on the L  B Ankle Dorsiflexion (L5,4): 5/5 B Great Toe Dorsiflexion (L5,4): 5/5 Heel Walk (L5): WNL Toe Walk (S1): WNL Rise/Squat (L4): WNL, mild pain  SENSORY  B Medial Foot (L4): B Dorsum (L5): B Lateral (S1):  All decreased to soft and pinprick touch Medial calf decreased to soft and pinprick  REFLEXES Knee (L4): 2+ Ankle (S1): 2+  B SLR, seated: pain only back B SLR, supine: back pain B Greater Troch: NT B Log Roll: neg B Stork: unable to complete B Sciatic Notch: ttp  Minimal ext or straightening of the legs L > R - beyond 20-30 deg causes focal back pain without radiation  2/5 r flexion at the hip Adduction 2/5 on the r hip 2/5 hip flexion on L 2/5 hip  add on the L  Remainder of the strength exam is 4/5  Radiology: DG Lumbar Spine Complete  Result Date: 08/30/2020 CLINICAL DATA:  5 months back pain EXAM: LUMBAR SPINE - COMPLETE 4+ VIEW COMPARISON:  None. FINDINGS: There is no evidence of fracture. There is preserved disc heights. There is no significant facet degenerative change. There is no osseous fusion. IMPRESSION: Negative lumbar spine radiographs. Electronically Signed   By: [name]   On: 08/30/2020 09:15   DG HIP UNILAT WITH PELVIS 2-3 VIEWS LEFT  Result Date: 08/30/2020 CLINICAL DATA:  5 months hip pain EXAM: DG HIP (WITH OR WITHOUT PELVIS) 2-3V LEFT COMPARISON:  None. FINDINGS: There is no evidence of fracture. There is no significant degenerative change. The alignment is normal IMPRESSION: Negative left hip radiographs. Electronically Signed   By: [name]   On: 08/30/2020 09:16     Assessment and Plan:     ICD-10-CM   1. Low back pain of over 3 months duration  M54.50 DG Lumbar Spine Complete    Ambulatory referral to Physical Therapy  2. Acute hip pain, bilateral  M25.551 DG HIP UNILAT WITH PELVIS 2-3 VIEWS LEFT   M25.552 Ambulatory referral to Physical Therapy  3. Weakness of both lower extremities  R29.898 Ambulatory referral to Physical Therapy  4. Weakness of both hips  R29.898 Ambulatory referral to Physical Therapy   Remarkably weak and inflexible at 24, suspect secondary to more long-standing pain and defense mechanism.  Most of the L foot has decreased sensation and the medial calf.  There is not a precise dermatomal pattern  14 days of steroids.  I am going to have him aggressively try to rehab and strengthen his lower body, formal PT, and then follow-up.  Meds ordered this encounter  Medications  . predniSONE (DELTASONE) 20 MG tablet    Sig: 2 tabs po for 7 days, then 1 tab po for 7 days    Dispense:  21 tablet    Refill:  0   Medications Discontinued During This Encounter  Medication Reason  .  predniSONE (DELTASONE) 20 MG tablet Completed Course   Orders Placed This Encounter  Procedures  . DG Lumbar Spine Complete  . DG HIP UNILAT WITH PELVIS 2-3 VIEWS LEFT  . Ambulatory referral to Physical Therapy    Follow-up: Return in about 6 weeks (around 10/09/2020).  Signed,  Elpidio Galea. Taelyn Broecker, MD   Outpatient Encounter Medications as of 08/28/2020  Medication Sig  . cyclobenzaprine (FLEXERIL) 5 MG tablet Take 1 tablet (5 mg total) by mouth at bedtime as needed for muscle spasms.  . predniSONE (DELTASONE) 20 MG tablet 2 tabs po for 7 days, then 1 tab po for 7 days  . [DISCONTINUED] predniSONE (DELTASONE) 20 MG tablet Take 2 tablets by mouth once daily for four days then 1 tablet daily for four days. (Patient not taking: Reported  on 08/28/2020)   No facility-administered encounter medications on file as of 08/28/2020.

## 2020-09-11 DIAGNOSIS — M5416 Radiculopathy, lumbar region: Secondary | ICD-10-CM | POA: Diagnosis not present

## 2020-09-18 DIAGNOSIS — M5416 Radiculopathy, lumbar region: Secondary | ICD-10-CM | POA: Diagnosis not present

## 2020-09-25 ENCOUNTER — Telehealth: Payer: Self-pay | Admitting: Primary Care

## 2020-09-25 NOTE — Telephone Encounter (Signed)
Please schedule follow up appointment with Dr. Patsy Lager for leg pain.

## 2020-09-25 NOTE — Telephone Encounter (Signed)
Patient call in requesting a note be sent in Mychart  Stating unable to work today because of the ongoing pain in left leg

## 2020-09-25 NOTE — Telephone Encounter (Signed)
I definitely need him to follow-up.  He looked bad at his last office visit.

## 2020-09-25 NOTE — Telephone Encounter (Signed)
Will send to treating MD. Karleen Hampshire, do you need for him to schedule a follow up visit?

## 2020-09-26 NOTE — Telephone Encounter (Signed)
Spoke with patient scheduled follow up appointment 

## 2020-10-01 NOTE — Progress Notes (Deleted)
Jaquawn Saffran T. Roy Tokarz, MD, CAQ Sports Medicine [facility] at [facility] [address] [place], [address]  Phone: [phone]  FAX: [phone]  Ronald Landry - 25 y.o. male  MRN [id number]  Date of Birth: [birth year]  Date: 10/02/2020  PCP: Doreene Nest, NP  Referral: Doreene Nest, NP  No chief complaint on file.   This visit occurred during the SARS-CoV-2 public health emergency.  Safety protocols were in place, including screening questions prior to the visit, additional usage of staff PPE, and extensive cleaning of exam room while observing appropriate contact time as indicated for disinfecting solutions.   Subjective:   Ronald Landry is a 25 y.o. very pleasant male patient with There is no height or weight on file to calculate BMI. who presents with the following:  Per my prior notes, he was having severe L sided radiculopathy with markedly impaired strength.  On his last OV, I was very concerned, gave him extended steroids, and sent him for formal PT.  I cannot find PT OV notes.    08/28/2020 Last OV with Ronald Beat, MD  This young gentleman has been having left-sided hip pain with some back pain and radiculopathy ongoing for about the last 6 months, but it has been escalating.   He has not had any form of injury that he can recall.  At this point he is having difficulty even sitting down, he is standing in the examination room today.  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   Generally, he is not that physically active. Previously, he has been doing some home stretches, and got 8 days of prednisone as well as   He some diffuse weakness, which is notably different compared to my partner's exam on 07/04/2020.   He has not had any falls or specific injuries.   No bowel or bladder incontinence.    L medial calf Entire foot - dorsum, lateral, medial, webspace all on the L   Minimal ext or  straightening - beyond 20-30 deg causes focal back pain without radiation   2/5 r flexion Adduction 2/5 on the r 2/5 flexion on L 2/5 add on the L   Exceptionally poor flexibility     Ronald Reel, NP: 07/04/2020 His pain is located to the left lower buttocks, anterior and posterior upper portion of his left lower extremity, proximal to the knee.   His pain began about 2 months ago, worse over the last 2 weeks. He describes his pain as tightness, "feels like someone is grasping". He does notice tingling at times. Pain occurs at rest, when trying to sleep at night, laying down, walking.   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 He denies known injury/trauma, lower back pain, right lower extremity pain. 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  ***  Radiology: DG Lumbar Spine Complete  Result Date: 08/30/2020 CLINICAL DATA:  5 months back pain EXAM: LUMBAR SPINE - COMPLETE 4+ VIEW COMPARISON:  None. FINDINGS: There is no evidence of fracture. There is preserved disc heights. There is no significant facet degenerative change. There is no osseous fusion. IMPRESSION: Negative lumbar spine radiographs. Electronically Signed   By: [name]   On:  08/30/2020 09:15   DG HIP UNILAT WITH PELVIS 2-3 VIEWS LEFT  Result Date: 08/30/2020 CLINICAL DATA:  5 months hip pain EXAM: DG HIP (WITH OR WITHOUT PELVIS) 2-3V LEFT COMPARISON:  None. FINDINGS: There is no evidence of fracture. There is no significant degenerative change. The alignment is normal IMPRESSION: Negative left hip radiographs. Electronically Signed   By: [name]   On: 08/30/2020 09:16     Assessment and Plan:

## 2020-10-02 ENCOUNTER — Ambulatory Visit: Payer: BC Managed Care – PPO | Admitting: Family Medicine

## 2020-10-02 DIAGNOSIS — M5416 Radiculopathy, lumbar region: Secondary | ICD-10-CM | POA: Diagnosis not present

## 2021-05-07 DIAGNOSIS — Z20822 Contact with and (suspected) exposure to covid-19: Secondary | ICD-10-CM | POA: Diagnosis not present

## 2021-05-07 DIAGNOSIS — J069 Acute upper respiratory infection, unspecified: Secondary | ICD-10-CM | POA: Diagnosis not present

## 2021-05-07 DIAGNOSIS — J029 Acute pharyngitis, unspecified: Secondary | ICD-10-CM | POA: Diagnosis not present

## 2021-07-23 ENCOUNTER — Ambulatory Visit: Payer: BC Managed Care – PPO | Admitting: Primary Care

## 2021-07-23 ENCOUNTER — Encounter: Payer: Self-pay | Admitting: Primary Care

## 2021-07-23 VITALS — BP 126/80 | HR 113 | Ht 70.25 in | Wt 202.0 lb

## 2021-07-23 DIAGNOSIS — M79605 Pain in left leg: Secondary | ICD-10-CM

## 2021-07-23 DIAGNOSIS — M545 Low back pain, unspecified: Secondary | ICD-10-CM | POA: Diagnosis not present

## 2021-07-23 DIAGNOSIS — R29898 Other symptoms and signs involving the musculoskeletal system: Secondary | ICD-10-CM | POA: Diagnosis not present

## 2021-07-23 MED ORDER — GABAPENTIN 100 MG PO CAPS: 100.0000 mg | ORAL_CAPSULE | Freq: Every day | ORAL | 0 refills | Status: DC

## 2021-07-23 NOTE — Progress Notes (Signed)
? ?Subjective:  ? ? Patient ID: Ronald Landry, male    DOB: 02/07/1996, 25 y.o.   MRN: [id number] ? ?HPI ? ?Ronald Landry is a very pleasant 26 y.o. male with a history of left lower extremity pain, left sided hip pain, left lower back pain who presents today to discuss left-sided back and lower extremity pain. ? ?Originally evaluated in March 2022 with left lower buttocks, anterior and posterior upper portion of the lower extremity pain that began 2 months prior.  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  He was treated with a course of steroids and muscle relaxers and was told to follow-up if no improvement. ? ?Evaluated by sports medicine in May 2022, exam was notable for left lower extremity weakness decree sensation to the left foot.  He was treated aggressively with a 14-day course of steroids and referred to formal physical therapy.  He was told to follow-up if no improvement. ? ?Today he endorses no improvement after prednisone courses and no improvement with physical therapy. He continues to notice pain to his left buttocks pain with radiation down to the ankle, anterior and posterior sides of his lower extremity. He continues to notice weakness to the left lower extremity with limited ROM due to pain. He will sometimes drag his foot.  ? ?He notices pain with standing, walking, sitting for more than 15 min. His pain has woken him from sleep a few times.  ? ?He denies numbness, loss of bowel/bladder control, injury or trauma. 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  ? ?He's been taking BC powder and Advil for his pain with temporary improvement.  ? ? ? ?Review of Systems  ?Genitourinary:   ?     No loss of bowel/bladder control  ?Musculoskeletal:  Positive for arthralgias and back pain.  ?     See HPI  ?Neurological:  Positive for weakness. Negative for numbness.  ? ?   ? ? ?Past Medical History:   ?Diagnosis Date  ? Elevated blood pressure reading   ? Frequent headaches   ? ? ?Social History  ? ?Socioeconomic History  ? Marital status: Single  ?  Spouse name: Not on file  ? Number of children: Not on file  ? Years of education: Not on file  ? Highest education level: Not on file  ?Occupational History  ? Not on file  ?Tobacco Use  ? Smoking status: Never  ? Smokeless tobacco: Never  ?Substance and Sexual Activity  ? Alcohol use: Yes  ? Drug use: Not on file  ? Sexual activity: Not on file  ?Other Topics Concern  ? Not on file  ?Social History Narrative  ? Not on file  ? ?Social Determinants of Health  ? ?Financial Resource Strain: Not on file  ?Food Insecurity: Not on file  ?Transportation Needs: Not on file  ?Physical Activity: Not on file  ?Stress: Not on file  ?Social Connections: Not on file  ?Intimate Partner Violence: Not on file  ? ? ?History reviewed. No pertinent surgical history. ? ?Family History  ?Problem Relation Age of Onset  ? Hyperlipidemia Mother   ? Hypertension Mother   ? Diabetes Father   ? Hyperlipidemia Father   ? Hypertension Father   ? Diabetes Paternal Grandmother   ? Hypertension Paternal Grandmother   ? Diabetes Paternal Grandfather   ? Hypertension Paternal Grandfather   ? ? ?  No Known Allergies ? ?Current Outpatient Medications on File Prior to Visit  ?Medication Sig Dispense Refill  ? Aspirin-Salicylamide-Caffeine (BC FAST PAIN RELIEF) 650-195-33.3 MG PACK Take by mouth.    ? Ibuprofen (ADVIL PO) Take by mouth.    ? ?No current facility-administered medications on file prior to visit.  ? ? ?BP 126/80   Pulse (!) 113   Ht 5' 10.25" (1.784 m)   Wt 202 lb (91.6 kg)   SpO2 95%   BMI 28.78 kg/m?  ?Objective:  ? Physical Exam ?Constitutional:   ?   General: He is not in acute distress. ?   Comments: Appears uncomfortable sitting in exam chair  ?Musculoskeletal:  ?     Legs: ? ?   Comments: Strength is overall equal to bilateral lower extremities, however both extremities are  weaker.  Limited range of motion to left lower extremity with flexion and extension.  ? ?Ambulating with moderate limp to left lower extremity. ?No decrease in sensation to left lower extremity compared to right.  ?Skin: ?   General: Skin is warm and dry.  ? ? ? ? ? ?   ?Assessment & Plan:  ? ? ? ? ?This visit occurred during the SARS-CoV-2 public health emergency.  Safety protocols were in place, including screening questions prior to the visit, additional usage of staff PPE, and extensive cleaning of exam room while observing appropriate contact time as indicated for disinfecting solutions.  ?

## 2021-07-23 NOTE — Assessment & Plan Note (Signed)
Continued despite conservative and prescription therapy. ? ?Given duration of symptoms, coupled with age and weakness we will obtain stat MRI lumbar spine and placed stat neurosurgery referral for evaluation. ? ?Prescription for low-dose gabapentin sent to pharmacy, he can take 100-200 mg at bedtime for pain.  Drowsiness precautions provided. ?

## 2021-07-23 NOTE — Patient Instructions (Signed)
You will be contacted regarding your referral to neurosurgery and also for the MRI of your back.  Please let us know if you have not been contacted within two days. ? ?You may take gabapentin medication at bedtime for pain.  You can take 1 or 2 capsules by mouth at bedtime.  This may cause drowsiness. ? ?It was a pleasure to see you today! ? ? ?

## 2021-07-24 ENCOUNTER — Other Ambulatory Visit: Payer: BC Managed Care – PPO

## 2021-07-26 ENCOUNTER — Ambulatory Visit
Admission: RE | Admit: 2021-07-26 | Discharge: 2021-07-26 | Disposition: A | Discharge status: Home / Self Care | Payer: BC Managed Care – PPO | Source: Ambulatory Visit | Attending: Primary Care | Admitting: Primary Care

## 2021-07-26 DIAGNOSIS — R29898 Other symptoms and signs involving the musculoskeletal system: Secondary | ICD-10-CM

## 2021-07-26 DIAGNOSIS — M545 Low back pain, unspecified: Secondary | ICD-10-CM | POA: Diagnosis not present

## 2021-07-26 DIAGNOSIS — M79605 Pain in left leg: Secondary | ICD-10-CM

## 2021-07-26 DIAGNOSIS — M5416 Radiculopathy, lumbar region: Secondary | ICD-10-CM | POA: Diagnosis not present

## 2021-08-19 DIAGNOSIS — Z6828 Body mass index (BMI) 28.0-28.9, adult: Secondary | ICD-10-CM | POA: Diagnosis not present

## 2021-08-19 DIAGNOSIS — M5126 Other intervertebral disc displacement, lumbar region: Secondary | ICD-10-CM | POA: Diagnosis not present

## 2021-09-03 DIAGNOSIS — M5126 Other intervertebral disc displacement, lumbar region: Secondary | ICD-10-CM | POA: Diagnosis not present

## 2021-09-03 DIAGNOSIS — M5127 Other intervertebral disc displacement, lumbosacral region: Secondary | ICD-10-CM | POA: Diagnosis not present

## 2022-04-14 DIAGNOSIS — R509 Fever, unspecified: Secondary | ICD-10-CM | POA: Diagnosis not present

## 2022-04-14 DIAGNOSIS — J101 Influenza due to other identified influenza virus with other respiratory manifestations: Secondary | ICD-10-CM | POA: Diagnosis not present

## 2022-04-14 DIAGNOSIS — R051 Acute cough: Secondary | ICD-10-CM | POA: Diagnosis not present

## 2022-04-14 DIAGNOSIS — R519 Headache, unspecified: Secondary | ICD-10-CM | POA: Diagnosis not present

## 2022-07-06 IMAGING — MR MR LUMBAR SPINE W/O CM
4 of 5 series · 27 of 48 positions shown · non-contrast
Comparison: Radiography 08/28/2020

CLINICAL DATA: Lumbar radiculopathy with symptoms persisting over 6
weeks of treatment. Pain radiates into the left leg

EXAM:
MRI LUMBAR SPINE WITHOUT CONTRAST
TECHNIQUE: Multiplanar, multisequence MR imaging of the lumbar spine was
performed. No intravenous contrast was administered.

[Series 3: T2 · sagittal · 4.0mm · 1.09mm/px · 5 of 14 slices shown (1 of 2)]
[im 1/14]
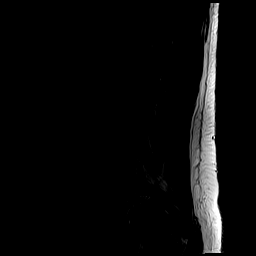
[im 4/14]
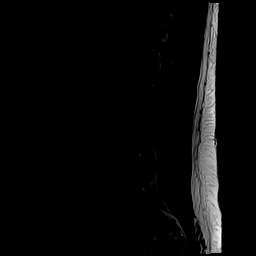
[im 7/14]
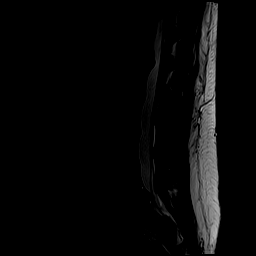
[im 10/14]
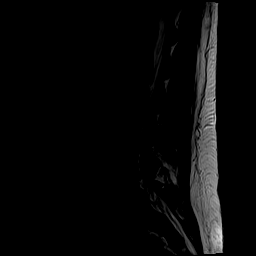
[im 14/14]
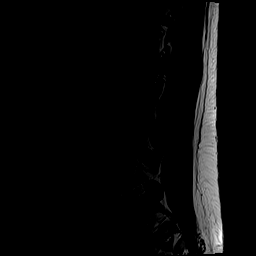

[Series 5: T1 · sagittal · 4.0mm · 1.09mm/px · 6 of 14 slices shown (1 of 2)]
[im 1/14]
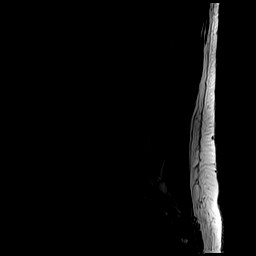
[im 3/14]
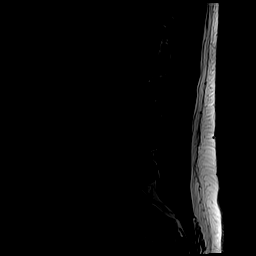
[im 6/14]
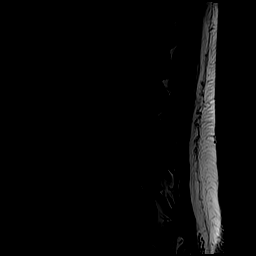
[im 8/14]
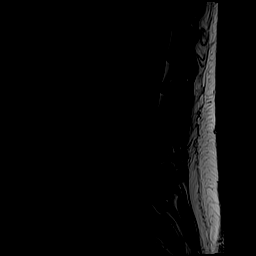
[im 11/14]
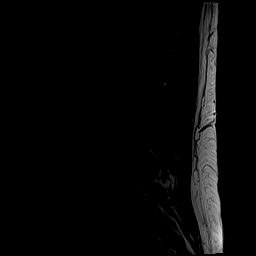
[im 14/14]
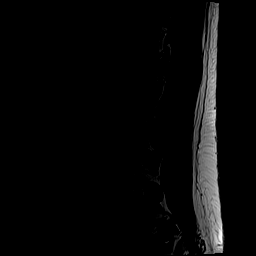

[Series 6: T2 · axial · 4.0mm · 0.39mm/px · z∈[-149,+45]mm · 10 of 39 slices shown (2 of 2)]
[im 3/39]
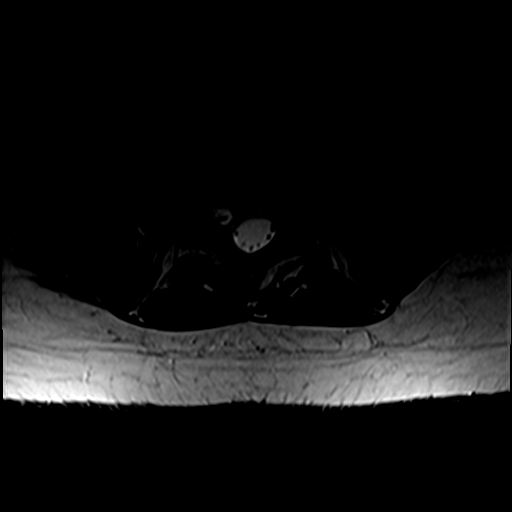
[im 6/39]
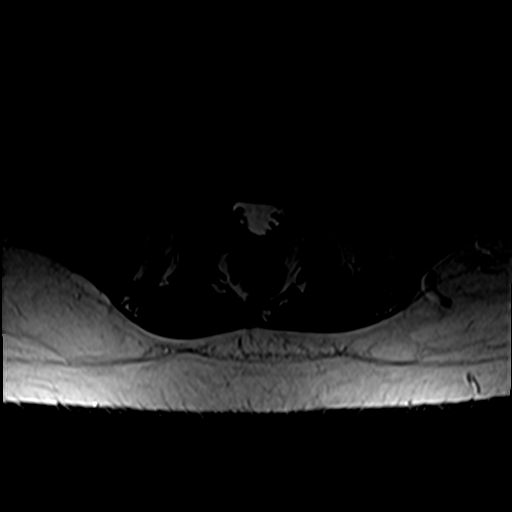
[im 8/39]
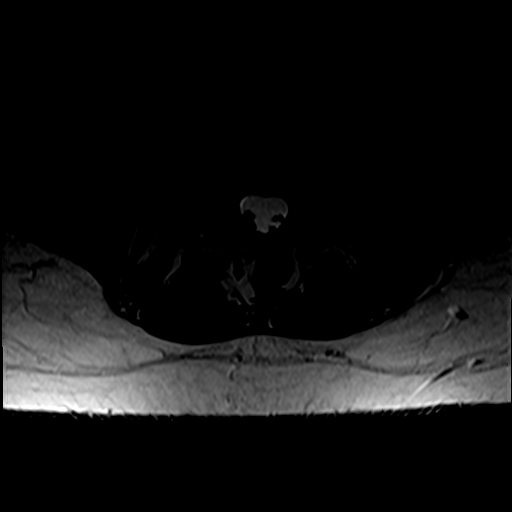
[im 13/39]
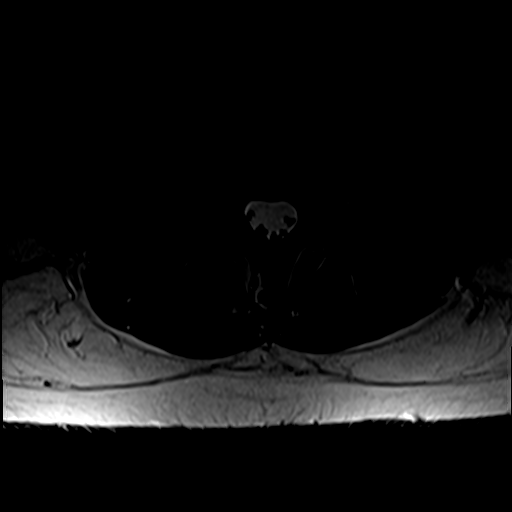
[im 18/39]
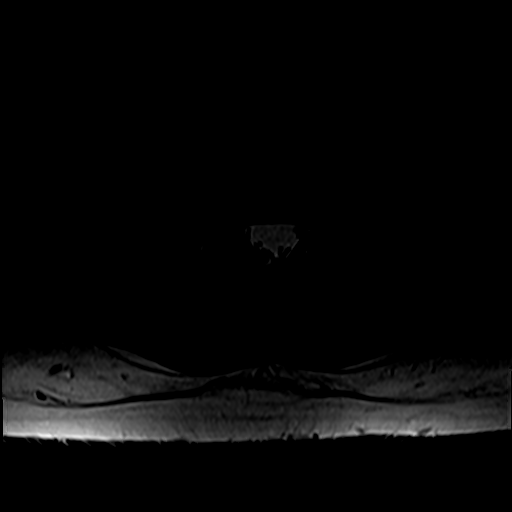
[im 21/39]
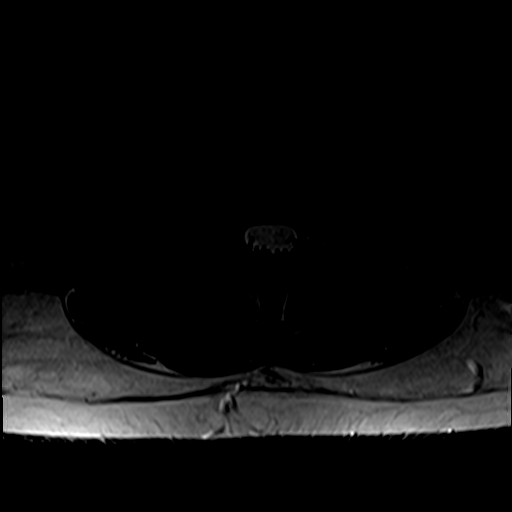
[im 23/39]
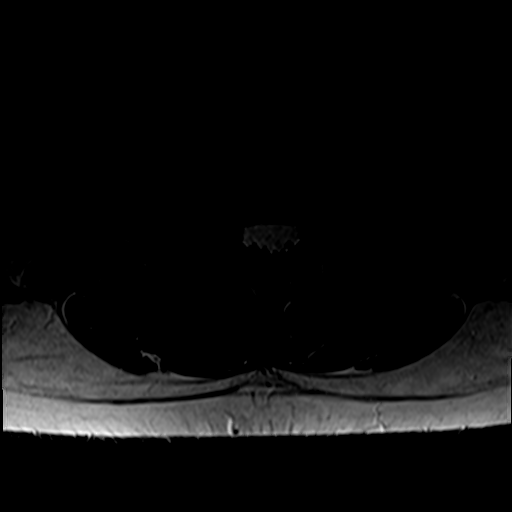
[im 28/39]
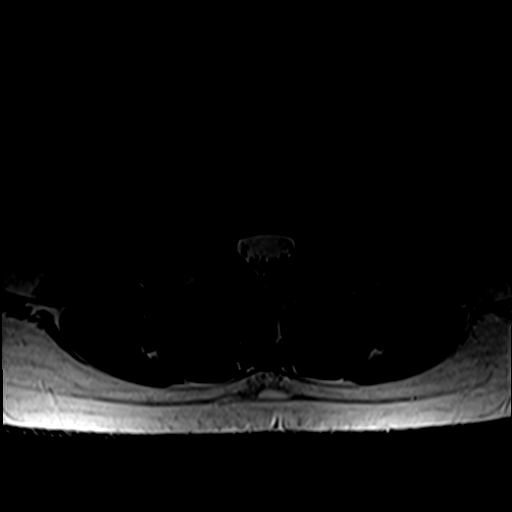
[im 33/39]
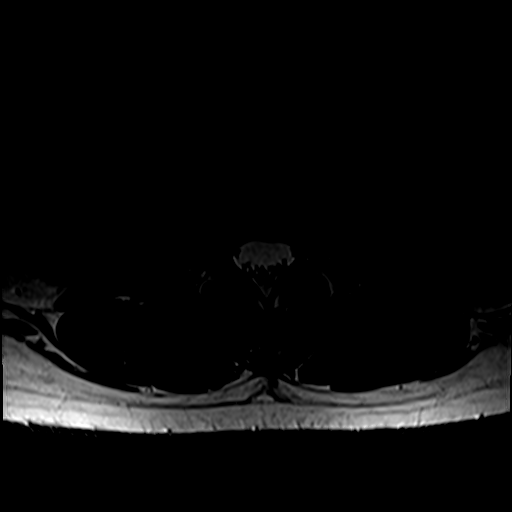
[im 39/39]
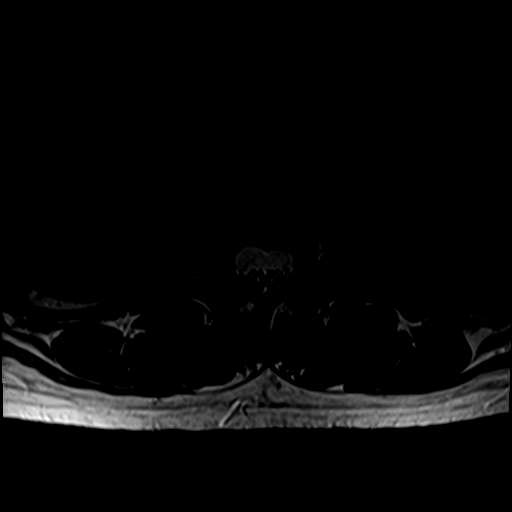

[Series 7: T1 · axial · 4.0mm · 0.39mm/px · z∈[-149,+16]mm · 6 of 39 slices shown (2 of 2)]
[im 3/39]
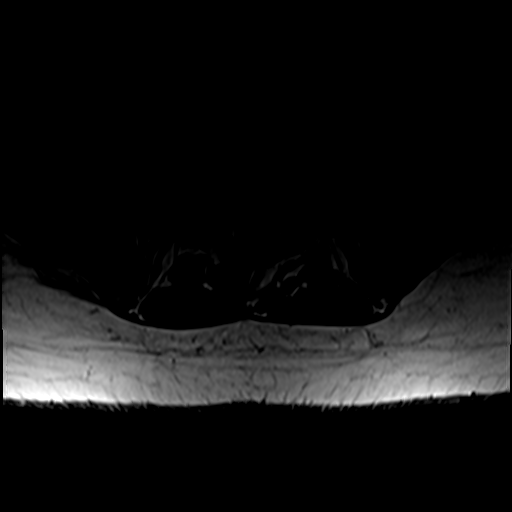
[im 6/39]
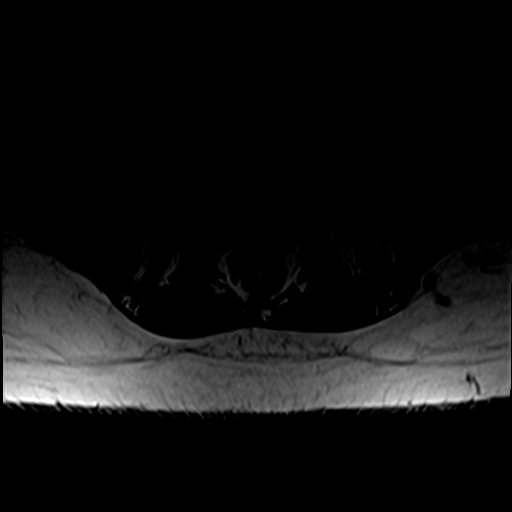
[im 8/39]
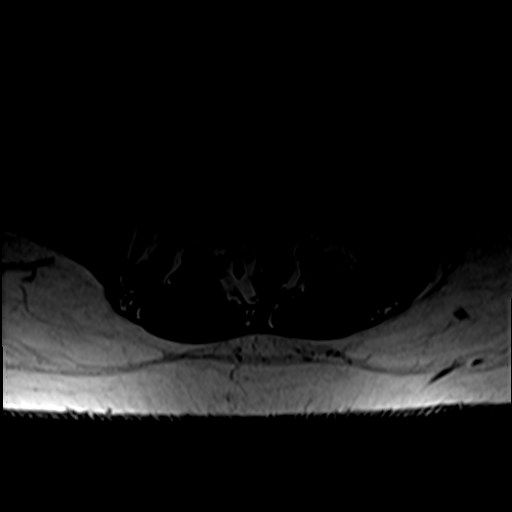
[im 13/39]
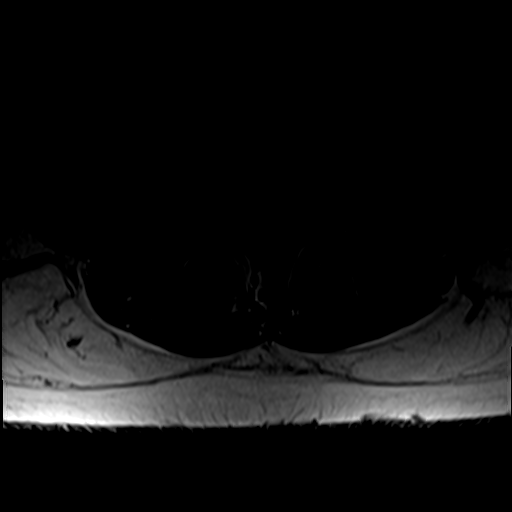
[im 21/39]
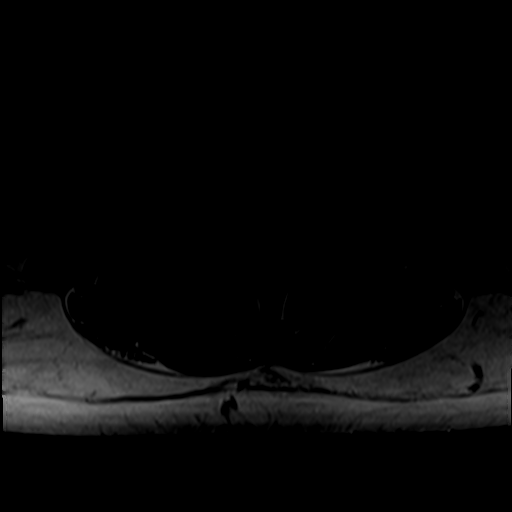
[im 33/39]
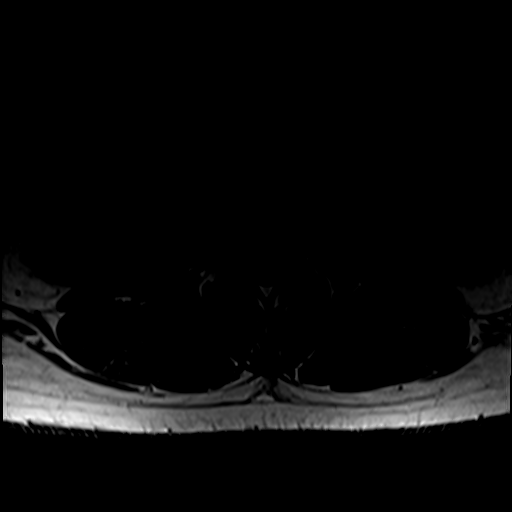

[27 of 48 positions shown; findings below may reference images not displayed]

FINDINGS: Segmentation:  5 lumbar type vertebrae

Alignment:  Physiologic.

Vertebrae:  No fracture, evidence of discitis, or bone lesion.

Conus medullaris and cauda equina: Conus extends to the L1 level.
Conus and cauda equina appear normal.

Paraspinal and other soft tissues: Negative for perispinal mass or
inflammation

Disc levels:

T12- L1: Unremarkable.

L1-L2: Central disc extrusion which is upward migrating to the mid
L1 body level. No neural impingement

L2-L3: Unremarkable.

L3-L4: Unremarkable.

L4-L5: Unremarkable.

L5-S1:Disc desiccation and narrowing with left paracentral to
foraminal herniation impinging on the left S1 nerve root. The left
facet at this level is hypoplastic.
IMPRESSION: 1. L5-S1 left paracentral to foraminal extrusion impinging on the
left S1 nerve root. The left L5-S1 facet is hypoplastic.
2. L1-2 upward migrating disc extrusion without neural impingement.

## 2022-09-17 ENCOUNTER — Telehealth: Payer: BC Managed Care – PPO | Admitting: Family Medicine

## 2022-09-17 DIAGNOSIS — J029 Acute pharyngitis, unspecified: Secondary | ICD-10-CM | POA: Diagnosis not present

## 2022-09-17 MED ORDER — AMOXICILLIN 500 MG PO TABS: 500.0000 mg | ORAL_TABLET | Freq: Two times a day (BID) | ORAL | 0 refills | Status: AC

## 2022-09-17 NOTE — Progress Notes (Signed)
Virtual Visit Consent   Ronald Landry, you are scheduled for a virtual visit with a [place] provider today. Just as with appointments in the office, your consent must be obtained to participate. Your consent will be active for this visit and any virtual visit you may have with one of our providers in the next 365 days. If you have a MyChart account, a copy of this consent can be sent to you electronically.  As this is a virtual visit, video technology does not allow for your provider to perform a traditional examination. This may limit your provider's ability to fully assess your condition. If your provider identifies any concerns that need to be evaluated in person or the need to arrange testing (such as labs, EKG, etc.), we will make arrangements to do so. Although advances in technology are sophisticated, we cannot ensure that it will always work on either your end or our end. If the connection with a video visit is poor, the visit may have to be switched to a telephone visit. With either a video or telephone visit, we are not always able to ensure that we have a secure connection.  By engaging in this virtual visit, you consent to the provision of healthcare and authorize for your insurance to be billed (if applicable) for the services provided during this visit. Depending on your insurance coverage, you may receive a charge related to this service.  I need to obtain your verbal consent now. Are you willing to proceed with your visit today? Ronald Landry has provided verbal consent on 09/17/2022 for a virtual visit (video or telephone). Freddy Finner, NP  Date: 09/17/2022 2:08 PM  Virtual Visit via Video Note   I, Freddy Finner, connected with  Ronald Landry  ([id number], May 23, 1995) on 09/17/22 at  2:15 PM EDT by a video-enabled telemedicine application and verified that I am speaking with the correct person using two identifiers.  Location: Patient: Virtual Visit Location Patient:  Home Provider: Virtual Visit Location Provider: Home Office   I discussed the limitations of evaluation and management by telemedicine and the availability of in person appointments. The patient expressed understanding and agreed to proceed.    History of Present Illness: Ronald Landry is a 27 y.o. who identifies as a male who was assigned male at birth, and is being seen today for sore throat  Onset was sore throat that 2 days ago- known sick contacts with grandfather- aunt is sick as well (unsure if strep) Associated symptoms are chills, hard to talk and swallow Modifying factors are tylenol Denies chest pain, shortness of breath, fevers, sinus drainage, or ear pain   Problems:  Patient Active Problem List   Diagnosis Date Noted   Pain of left lower extremity 07/04/2020   Chronic foot pain 09/13/2018   Elevated blood pressure reading 09/13/2018    Allergies: No Known Allergies Medications:  Current Outpatient Medications:    Aspirin-Salicylamide-Caffeine (BC FAST PAIN RELIEF) 650-195-33.3 MG PACK, Take by mouth., Disp: , Rfl:    gabapentin (NEURONTIN) 100 MG capsule, Take 1-2 capsules (100-200 mg total) by mouth at bedtime. For pain., Disp: 60 capsule, Rfl: 0   Ibuprofen (ADVIL PO), Take by mouth., Disp: , Rfl:   Observations/Objective: Patient is well-developed, well-nourished in no acute distress.  Resting comfortably  at home.  Head is normocephalic, atraumatic.  No labored breathing.  Speech is clear and coherent with logical content.  Patient is alert and oriented at baseline.  Muffled sound in voice  Assessment and Plan:  1. Pharyngitis, unspecified etiology  - amoxicillin (AMOXIL) 500 MG tablet; Take 1 tablet (500 mg total) by mouth 2 (two) times daily for 10 days.  Dispense: 20 tablet; Refill: 0  -given S&S will treat for possible pharyngitis  -in person precautions reviewed  -work note provided  - Discussed OTC management  - May use tylenol and ibuprofen  alternating every 3-4 hours as needed for pain, fevers, body aches - Salt water gargles - Hydration and voice rest - Seek in person evaluation if worsening or fails to improve   Reviewed side effects, risks and benefits of medication.    Patient acknowledged agreement and understanding of the plan.   Past Medical, Surgical, Social History, Allergies, and Medications have been Reviewed.    Follow Up Instructions: I discussed the assessment and treatment plan with the patient. The patient was provided an opportunity to ask questions and all were answered. The patient agreed with the plan and demonstrated an understanding of the instructions.  A copy of instructions were sent to the patient via MyChart unless otherwise noted below.    The patient was advised to call back or seek an in-person evaluation if the symptoms worsen or if the condition fails to improve as anticipated.  Time:  I spent 9 minutes with the patient via telehealth technology discussing the above problems/concerns.    Freddy Finner, NP

## 2022-09-17 NOTE — Patient Instructions (Addendum)
  Evelina Dun, thank you for joining Freddy Finner, NP for today's virtual visit.  While this provider is not your primary care provider (PCP), if your PCP is located in our provider database this encounter information will be shared with them immediately following your visit.   A [facility] MyChart account gives you access to today's visit and all your visits, tests, and labs performed at [facility] " click here if you don't have a [facility] MyChart account or go to [email or web address]  Consent: (Patient) Ronald Landry provided verbal consent for this virtual visit at the beginning of the encounter.  Current Medications:  Current Outpatient Medications:    amoxicillin (AMOXIL) 500 MG tablet, Take 1 tablet (500 mg total) by mouth 2 (two) times daily for 10 days., Disp: 20 tablet, Rfl: 0   Aspirin-Salicylamide-Caffeine (BC FAST PAIN RELIEF) 650-195-33.3 MG PACK, Take by mouth., Disp: , Rfl:    gabapentin (NEURONTIN) 100 MG capsule, Take 1-2 capsules (100-200 mg total) by mouth at bedtime. For pain., Disp: 60 capsule, Rfl: 0   Ibuprofen (ADVIL PO), Take by mouth., Disp: , Rfl:    Medications ordered in this encounter:  Meds ordered this encounter  Medications   amoxicillin (AMOXIL) 500 MG tablet    Sig: Take 1 tablet (500 mg total) by mouth 2 (two) times daily for 10 days.    Dispense:  20 tablet    Refill:  0    Order Specific Question:   Supervising Provider    Answer:   Merrilee Jansky [id number]     *If you need refills on other medications prior to your next appointment, please contact your pharmacy*  Follow-Up: Call back or seek an in-person evaluation if the symptoms worsen or if the condition fails to improve as anticipated.  [facility] [phone]  Other Instructions   - May use tylenol and ibuprofen alternating every 3-4 hours as needed for pain, fevers, body aches - Salt water gargles - Hydration and voice rest - Seek in  person evaluation if worsening or fails to improve    If you have been instructed to have an in-person evaluation today at a local Urgent Care facility, please use the link below. It will take you to a list of all of our available [facility], including address, phone number and hours of operation. Please do not delay care.  [facility]  If you or a family member do not have a primary care provider, use the link below to schedule a visit and establish care. When you choose a [facility] primary care physician or advanced practice provider, you gain a long-term partner in health. Find a Primary Care Provider  Learn more about [facility]'s in-office and virtual care options: [facility] - Get Care Now

## 2023-01-07 ENCOUNTER — Encounter: Payer: Self-pay | Admitting: Primary Care

## 2023-01-07 ENCOUNTER — Telehealth: Payer: BC Managed Care – PPO | Admitting: Primary Care

## 2023-01-07 DIAGNOSIS — J069 Acute upper respiratory infection, unspecified: Secondary | ICD-10-CM | POA: Insufficient documentation

## 2023-01-07 NOTE — Assessment & Plan Note (Signed)
Viral URI suspected given symptoms. Fortunately, he is feeling much better and appears well.  Continue Tylenol as needed. Okay to return to work tomorrow as intended.  Work note provided today.

## 2023-01-07 NOTE — Patient Instructions (Signed)
You may return to work tomorrow as long as you are still feeling better and do not have a fever.  It was a pleasure to see you today!

## 2023-01-07 NOTE — Progress Notes (Signed)
Patient ID: Ronald Landry, male    DOB: 04/14/1996, 27 y.o.   MRN: [id number]  Virtual visit completed through [employer], a video enabled telemedicine application. Due to national recommendations of social distancing due to COVID-19, a virtual visit is felt to be most appropriate for this patient at this time. Reviewed limitations, risks, security and privacy concerns of performing a virtual visit and the availability of in person appointments. I also reviewed that there may be a patient responsible charge related to this service. The patient agreed to proceed.   Patient location: home Provider location: [place] at [facility], office Persons participating in this virtual visit: patient, provider   If any vitals were documented, they were collected by patient at home unless specified below.    There were no vitals taken for this visit.   CC: URI  Subjective:   HPI: Ronald Landry is a 27 y.o. male presenting on 01/07/2023 for Sore Throat (Sore throat, runny nose, chills, fever, sneezing /Started Tuesday evening and now is feeling better, fever broke early this morning. Patient is requesting work note to excuse from Tuesday-Thursday. He plans to return to work tomorrow. Toni Amend not do Covid test )  Symptom onset 3 evenings ago with fatigue. He then developed sore throat, chills, runny nose, sneezing, cough, fevers (101). Today he's feeling much better as most of his symptoms have resolved. He denies fevers today. He took Tylenol which broke his fever yesterday. He has not tested for Covid-19 infection.  A few people have been sick at work recently.  He is needing a work note to excuse him for 09/17-09/19. He plans on returning to work tomorrow.       Relevant past medical, surgical, family and social history reviewed and updated as indicated. Interim medical history since our last visit reviewed. Allergies and medications reviewed and updated. Outpatient Medications Prior to Visit   Medication Sig Dispense Refill   Aspirin-Salicylamide-Caffeine (BC FAST PAIN RELIEF) 650-195-33.3 MG PACK Take by mouth.     Ibuprofen (ADVIL PO) Take by mouth.     gabapentin (NEURONTIN) 100 MG capsule Take 1-2 capsules (100-200 mg total) by mouth at bedtime. For pain. 60 capsule 0   No facility-administered medications prior to visit.     Per HPI unless specifically indicated in ROS section below Review of Systems  Constitutional:  Positive for fatigue. Negative for chills and fever.  HENT:  Negative for congestion and sore throat.   Respiratory:  Negative for cough.   Gastrointestinal:  Negative for diarrhea.   Objective:  There were no vitals taken for this visit.  Wt Readings from Last 3 Encounters:  07/23/21 202 lb (91.6 kg)  08/28/20 192 lb (87.1 kg)  07/30/20 193 lb (87.5 kg)       Physical exam: General: Alert and oriented x 3, no distress, does not appear sickly  Pulmonary: Speaks in complete sentences without increased work of breathing, no cough during visit.  Psychiatric: Normal mood, thought content, and behavior.     Results for orders placed or performed in visit on 04/25/19  CBC with Differential/Platelet  Result Value Ref Range   WBC 6.1 4.0 - 10.5 K/uL   RBC 4.59 4.22 - 5.81 Mil/uL   Hemoglobin 15.1 13.0 - 17.0 g/dL   HCT 40.9 81.1 - 91.4 %   MCV 96.6 78.0 - 100.0 fl   MCHC 34.2 30.0 - 36.0 g/dL   RDW 78.2 95.6 - 21.3 %   Platelets 315.0 150.0 - 400.0  K/uL   Neutrophils Relative % 60.6 43.0 - 77.0 %   Lymphocytes Relative 28.8 12.0 - 46.0 %   Monocytes Relative 5.9 3.0 - 12.0 %   Eosinophils Relative 3.6 0.0 - 5.0 %   Basophils Relative 1.1 0.0 - 3.0 %   Neutro Abs 3.7 1.4 - 7.7 K/uL   Lymphs Abs 1.8 0.7 - 4.0 K/uL   Monocytes Absolute 0.4 0.1 - 1.0 K/uL   Eosinophils Absolute 0.2 0.0 - 0.7 K/uL   Basophils Absolute 0.1 0.0 - 0.1 K/uL  Uric acid  Result Value Ref Range   Uric Acid, Serum 7.2 4.0 - 7.8 mg/dL   Assessment & Plan:   Problem  List Items Addressed This Visit       Respiratory   URI (upper respiratory infection) - Primary    Viral URI suspected given symptoms. Fortunately, he is feeling much better and appears well.  Continue Tylenol as needed. Okay to return to work tomorrow as intended.  Work note provided today.        No orders of the defined types were placed in this encounter.  No orders of the defined types were placed in this encounter.   I discussed the assessment and treatment plan with the patient. The patient was provided an opportunity to ask questions and all were answered. The patient agreed with the plan and demonstrated an understanding of the instructions. The patient was advised to call back or seek an in-person evaluation if the symptoms worsen or if the condition fails to improve as anticipated.  Follow up plan:  You may return to work tomorrow as long as you are still feeling better and do not have a fever.  It was a pleasure to see you today!   Doreene Nest, NP

## 2023-01-15 DIAGNOSIS — R509 Fever, unspecified: Secondary | ICD-10-CM | POA: Diagnosis not present

## 2023-01-15 DIAGNOSIS — R0981 Nasal congestion: Secondary | ICD-10-CM | POA: Diagnosis not present

## 2023-09-24 ENCOUNTER — Ambulatory Visit: Admitting: Primary Care

## 2023-09-24 ENCOUNTER — Encounter: Payer: Self-pay | Admitting: Primary Care

## 2023-09-24 VITALS — BP 114/68 | HR 86 | Temp 99.1°F | Ht 70.25 in | Wt 227.0 lb

## 2023-09-24 DIAGNOSIS — R21 Rash and other nonspecific skin eruption: Secondary | ICD-10-CM

## 2023-09-24 LAB — CBC WITH DIFFERENTIAL/PLATELET
Basophils Absolute: 0.1 10*3/uL (ref 0.0–0.1)
Basophils Relative: 1.3 % (ref 0.0–3.0)
Eosinophils Absolute: 0.8 10*3/uL — ABNORMAL HIGH (ref 0.0–0.7)
Eosinophils Relative: 15.3 % — ABNORMAL HIGH (ref 0.0–5.0)
HCT: 44.8 % (ref 39.0–52.0)
Hemoglobin: 15.2 g/dL (ref 13.0–17.0)
Lymphocytes Relative: 23.3 % (ref 12.0–46.0)
Lymphs Abs: 1.3 10*3/uL (ref 0.7–4.0)
MCHC: 34 g/dL (ref 30.0–36.0)
MCV: 97 fl (ref 78.0–100.0)
Monocytes Absolute: 0.3 10*3/uL (ref 0.1–1.0)
Monocytes Relative: 6.4 % (ref 3.0–12.0)
Neutro Abs: 3 10*3/uL (ref 1.4–7.7)
Neutrophils Relative %: 53.7 % (ref 43.0–77.0)
Platelets: 269 10*3/uL (ref 150.0–400.0)
RBC: 4.62 Mil/uL (ref 4.22–5.81)
RDW: 12.3 % (ref 11.5–15.5)
WBC: 5.5 10*3/uL (ref 4.0–10.5)

## 2023-09-24 MED ORDER — METHYLPREDNISOLONE ACETATE 80 MG/ML IJ SUSP
80.0000 mg | Freq: Once | INTRAMUSCULAR | Status: AC
  Administered 2023-09-24: 80 mg via INTRAMUSCULAR

## 2023-09-24 MED ORDER — PREDNISONE 20 MG PO TABS: ORAL_TABLET | ORAL | 0 refills | Status: DC

## 2023-09-24 NOTE — Assessment & Plan Note (Signed)
 Unclear etiology.  Question allergic dermatitis, contact dermatitis as rashes do not cross to the face or lower extremities.  Depo-Medrol  80 mg provided intramuscularly today.  Start prednisone  20 mg tablets tomorrow. Take 3 tablets by mouth every morning x 3 days, then 2 tablets for 3 days, then 1 tablet for 3 days.  CBC with differential, alpha gal, food allergy panel pending today. He will update.

## 2023-09-24 NOTE — Patient Instructions (Signed)
 Start prednisone  steroid pills tomorrow morning. Take 3 tablets by mouth every morning x 3 days, then 2 tablets for 3 days, then 1 tablet for 3 days.  Stop by the lab prior to leaving today. I will notify you of your results once received.   Please keep me updated regarding your rash, especially the redness to your right arm.  It was a pleasure to see you today!

## 2023-09-24 NOTE — Progress Notes (Signed)
 Subjective:    Patient ID: Ronald Landry, male    DOB: 06/15/1995, 28 y.o.   MRN: [id number]  Rash Pertinent negatives include no cough or fever.    Ronald Landry is a very pleasant 28 y.o. male who presents today to discuss rashes.  Symptom onset approximately 1 week ago with red, itchy rashes to his bilateral upper extremities anterior and posterior trunk. His rashes will wax and wane. He's noticed increased redness and warmth with "tough skin" to the right posterior forearm.   He's applied OTC poison ivy treatment and lotions without improvement.  He has taken Benadryl a few times with temporary improvement.  No one else in the house has this rash.  He does not take an antihistamine regularly.  He's been under a lot of stress with work and home. He denies injury/trauma, open wounds. He has noticed chills.   No new lotions, detergents, soaps or shampoos. No new medicines, vitamins, supplements. No new pets. No recent outdoor exposure or poison ivy exposure. No bonfire or smoke exposure.  No recent motel or hotel stay or new beds.   No fevers, oral lesions, new joint pains, tick bites, abdominal pain, nausea.      Review of Systems  Constitutional:  Positive for chills. Negative for fever.  Respiratory:  Negative for cough.   Skin:  Positive for color change and rash.  Psychiatric/Behavioral:  The patient is nervous/anxious.          Past Medical History:  Diagnosis Date   Elevated blood pressure reading    Frequent headaches     Social History   Socioeconomic History   Marital status: Single    Spouse name: Not on file   Number of children: Not on file   Years of education: Not on file   Highest education level: Not on file  Occupational History   Not on file  Tobacco Use   Smoking status: Never   Smokeless tobacco: Never  Substance and Sexual Activity   Alcohol use: Yes   Drug use: Not on file   Sexual activity: Not on file  Other Topics Concern    Not on file  Social History Narrative   Not on file   Social Drivers of Health   Financial Resource Strain: Not on file  Food Insecurity: Not on file  Transportation Needs: Not on file  Physical Activity: Not on file  Stress: Not on file  Social Connections: Not on file  Intimate Partner Violence: Not on file    History reviewed. No pertinent surgical history.  Family History  Problem Relation Age of Onset   Hyperlipidemia Mother    Hypertension Mother    Diabetes Father    Hyperlipidemia Father    Hypertension Father    Diabetes Paternal Grandmother    Hypertension Paternal Grandmother    Diabetes Paternal Grandfather    Hypertension Paternal Grandfather     No Known Allergies  Current Outpatient Medications on File Prior to Visit  Medication Sig Dispense Refill   Aspirin-Salicylamide-Caffeine (BC FAST PAIN RELIEF) 650-195-33.3 MG PACK Take by mouth.     Ibuprofen (ADVIL PO) Take by mouth.     gabapentin  (NEURONTIN ) 100 MG capsule Take 1-2 capsules (100-200 mg total) by mouth at bedtime. For pain. (Patient not taking: Reported on 09/24/2023) 60 capsule 0   No current facility-administered medications on file prior to visit.    BP 114/68   Pulse 86   Temp 99.1 F (37.3 C) (Temporal)  Ht 5' 10.25" (1.784 m)   Wt 227 lb (103 kg)   SpO2 98%   BMI 32.34 kg/m  Objective:   Physical Exam Constitutional:      General: He is not in acute distress. Cardiovascular:     Rate and Rhythm: Normal rate and regular rhythm.  Skin:    General: Skin is warm and dry.     Findings: Erythema and rash present.     Comments: Patchy erythematous rashes to anterior and posterior trunk, bilateral upper extremities including left antecubital fossa and forearms.  No rashes or tracking in between fingers.  Neurological:     Mental Status: He is alert.           Assessment & Plan:  Rash and nonspecific skin eruption Assessment & Plan: Unclear etiology.  Question allergic  dermatitis, contact dermatitis as rashes do not cross to the face or lower extremities.  Depo-Medrol  80 mg provided intramuscularly today.  Start prednisone  20 mg tablets tomorrow. Take 3 tablets by mouth every morning x 3 days, then 2 tablets for 3 days, then 1 tablet for 3 days.  CBC with differential, alpha gal, food allergy panel pending today. He will update.  Orders: -     CBC with Differential/Platelet -     Alpha-Gal Panel -     Food Allergy Profile -     predniSONE ; Take 3 tablets by mouth every morning x 3 days, then 2 tablets for 3 days, then 1 tablet for 3 days.  Dispense: 18 tablet; Refill: 0 -     methylPREDNISolone  Acetate        Chinaza Rooke K Kaylob Wallen, NP

## 2023-09-27 ENCOUNTER — Ambulatory Visit: Payer: Self-pay | Admitting: Primary Care

## 2023-09-27 ENCOUNTER — Other Ambulatory Visit: Payer: Self-pay | Admitting: Primary Care

## 2023-09-27 DIAGNOSIS — Z91018 Allergy to other foods: Secondary | ICD-10-CM

## 2023-09-27 LAB — ALPHA-GAL PANEL
Allergen, Mutton, f88: 0.12 kU/L — ABNORMAL HIGH
Allergen, Pork, f26: <0.1 kU/L
Beef: 0.11 kU/L — ABNORMAL HIGH
CLASS: 0

## 2023-09-27 LAB — FOOD ALLERGY PROFILE
Allergen, Salmon, f41: <0.1 kU/L
Almonds: 1.97 kU/L — ABNORMAL HIGH
Brazil Nut: <0.1 kU/L
CLASS: 0
CLASS: 0
CLASS: 2
CLASS: 2
CLASS: 2
CLASS: 2
CLASS: 3
CLASS: 3
CLASS: 5
Cashew IgE: 0.21 kU/L — ABNORMAL HIGH
Class: 0
Class: 0
Class: 0
Class: 1
Class: 3
Egg White IgE: <0.1 kU/L
Fish Cod: <0.1 kU/L
Hazelnut: 69.9 kU/L — ABNORMAL HIGH
Macadamia Nut: 1.39 kU/L — ABNORMAL HIGH
Milk IgE: <0.1 kU/L
Peanut IgE: 4.07 kU/L — ABNORMAL HIGH
Scallop IgE: 0.16 kU/L — ABNORMAL HIGH
Sesame Seed f10: 9.34 kU/L — ABNORMAL HIGH
Shrimp IgE: 0.47 kU/L — ABNORMAL HIGH
Soybean IgE: 3.34 kU/L — ABNORMAL HIGH
Tuna IgE: 0.14 kU/L — ABNORMAL HIGH
Walnut: 0.9 kU/L — ABNORMAL HIGH
Wheat IgE: 3.69 kU/L — ABNORMAL HIGH

## 2023-09-27 LAB — INTERPRETATION:

## 2023-09-27 MED ORDER — EPINEPHRINE 0.3 MG/0.3ML IJ SOAJ: 0.3000 mg | INTRAMUSCULAR | 0 refills | Status: AC | PRN

## 2023-09-28 ENCOUNTER — Other Ambulatory Visit (INDEPENDENT_AMBULATORY_CARE_PROVIDER_SITE_OTHER)

## 2023-09-28 ENCOUNTER — Ambulatory Visit: Admitting: Primary Care

## 2023-09-28 DIAGNOSIS — Z91018 Allergy to other foods: Secondary | ICD-10-CM

## 2023-10-06 ENCOUNTER — Ambulatory Visit: Payer: Self-pay | Admitting: Primary Care

## 2023-10-06 DIAGNOSIS — Z91018 Allergy to other foods: Secondary | ICD-10-CM

## 2023-10-06 LAB — CELIAC PNL 2 RFLX ENDOMYSIAL AB TTR
(tTG) Ab, IgA: <1 U/mL
(tTG) Ab, IgG: <1 U/mL
Endomysial Ab IgA: NEGATIVE
Gliadin IgA: <1 U/mL
Gliadin IgG: <1 U/mL
Immunoglobulin A: 333 mg/dL — ABNORMAL HIGH (ref 47–310)

## 2023-10-06 MED ORDER — CETIRIZINE HCL 10 MG PO TABS: 10.0000 mg | ORAL_TABLET | Freq: Every day | ORAL | 0 refills | Status: AC

## 2023-11-03 ENCOUNTER — Ambulatory Visit: Admitting: Internal Medicine

## 2023-12-16 ENCOUNTER — Ambulatory Visit: Admitting: Internal Medicine

## 2024-03-01 ENCOUNTER — Ambulatory Visit: Payer: Self-pay | Admitting: Primary Care

## 2024-03-01 ENCOUNTER — Ambulatory Visit (INDEPENDENT_AMBULATORY_CARE_PROVIDER_SITE_OTHER): Admitting: Primary Care

## 2024-03-01 ENCOUNTER — Encounter: Payer: Self-pay | Admitting: Primary Care

## 2024-03-01 VITALS — BP 132/84 | HR 84 | Temp 98.3°F | Ht 70.25 in | Wt 242.0 lb

## 2024-03-01 DIAGNOSIS — J029 Acute pharyngitis, unspecified: Secondary | ICD-10-CM | POA: Diagnosis not present

## 2024-03-01 DIAGNOSIS — R509 Fever, unspecified: Secondary | ICD-10-CM | POA: Diagnosis not present

## 2024-03-01 DIAGNOSIS — J020 Streptococcal pharyngitis: Secondary | ICD-10-CM | POA: Diagnosis not present

## 2024-03-01 LAB — POCT INFLUENZA A/B
Influenza A, POC: NEGATIVE
Influenza B, POC: NEGATIVE

## 2024-03-01 LAB — POC COVID19 BINAXNOW: SARS Coronavirus 2 Ag: NEGATIVE

## 2024-03-01 LAB — POCT RAPID STREP A (OFFICE): Rapid Strep A Screen: POSITIVE — AB

## 2024-03-01 MED ORDER — AMOXICILLIN 500 MG PO CAPS: 500.0000 mg | ORAL_CAPSULE | Freq: Two times a day (BID) | ORAL | 0 refills | Status: AC

## 2024-03-01 NOTE — Patient Instructions (Signed)
 Start amoxicillin  antibiotics for the infection. Take 1 tablet by mouth twice daily for 10 days.  Be sure to rest and hydrate.  It was a pleasure to see you today!

## 2024-03-01 NOTE — Progress Notes (Signed)
 Subjective:    Patient ID: Ronald Landry, male    DOB: 08-Jul-1995, 28 y.o.   MRN: [id number]  Ronald Landry is a very pleasant 28 y.o. male who presents today to discuss URI symptoms.   Symptom onset two days ago with body aches, sore throat. He then developed cough with chest congestion, nausea, and fevers with temperature max of 104.1. His last fever was this morning. He has not vomited.   He's taken Tylenol Day and Night and Excedrin Migraine for fever reduction and pain. He denies any sick contacts.    He's missed work yesterday and today. He has not completed an influenza vaccine.      Review of Systems  Constitutional:  Positive for chills, fatigue and fever.  HENT:  Positive for congestion, postnasal drip and sore throat.   Respiratory:  Positive for cough.   Gastrointestinal:  Positive for nausea.         Past Medical History:  Diagnosis Date   Elevated blood pressure reading    Frequent headaches     Social History   Socioeconomic History   Marital status: Single    Spouse name: Not on file   Number of children: Not on file   Years of education: Not on file   Highest education level: Not on file  Occupational History   Not on file  Tobacco Use   Smoking status: Never   Smokeless tobacco: Never  Substance and Sexual Activity   Alcohol use: Yes   Drug use: Not on file   Sexual activity: Not on file  Other Topics Concern   Not on file  Social History Narrative   Not on file   Social Drivers of Health   Financial Resource Strain: Not on file  Food Insecurity: Not on file  Transportation Needs: Not on file  Physical Activity: Not on file  Stress: Not on file  Social Connections: Not on file  Intimate Partner Violence: Not on file    History reviewed. No pertinent surgical history.  Family History  Problem Relation Age of Onset   Hyperlipidemia Mother    Hypertension Mother    Diabetes Father    Hyperlipidemia Father    Hypertension  Father    Diabetes Paternal Grandmother    Hypertension Paternal Grandmother    Diabetes Paternal Grandfather    Hypertension Paternal Grandfather     Allergies  Allergen Reactions   Hazelnut (Filbert)    Macadamia Nut Oil    Peanut (Diagnostic)    Sesame Seed (Diagnostic)    Soybean-Containing Drug Products    Wheat     Current Outpatient Medications on File Prior to Visit  Medication Sig Dispense Refill   Aspirin-Salicylamide-Caffeine (BC FAST PAIN RELIEF) 650-195-33.3 MG PACK Take by mouth.     cetirizine  (ZYRTEC ) 10 MG tablet Take 1 tablet (10 mg total) by mouth at bedtime. For allergies 90 tablet 0   EPINEPHrine  0.3 mg/0.3 mL IJ SOAJ injection Inject 0.3 mg into the muscle as needed for anaphylaxis. 1 each 0   Ibuprofen (ADVIL PO) Take by mouth.     No current facility-administered medications on file prior to visit.    BP 132/84   Pulse 84   Temp 98.3 F (36.8 C) (Oral)   Ht 5' 10.25 (1.784 m)   Wt 242 lb (109.8 kg)   SpO2 98%   BMI 34.48 kg/m  Objective:   Physical Exam Constitutional:      Appearance: He is ill-appearing.  HENT:  Right Ear: Tympanic membrane and ear canal normal.     Left Ear: Tympanic membrane and ear canal normal.     Nose: No mucosal edema.     Right Sinus: No maxillary sinus tenderness or frontal sinus tenderness.     Left Sinus: No maxillary sinus tenderness or frontal sinus tenderness.     Mouth/Throat:     Mouth: Mucous membranes are moist.     Pharynx: Posterior oropharyngeal erythema present. No oropharyngeal exudate.  Eyes:     Conjunctiva/sclera: Conjunctivae normal.  Cardiovascular:     Rate and Rhythm: Normal rate and regular rhythm.  Pulmonary:     Effort: Pulmonary effort is normal.     Breath sounds: Normal breath sounds. No wheezing or rhonchi.  Musculoskeletal:     Cervical back: Neck supple.  Skin:    General: Skin is warm and dry.     Physical Exam        Assessment & Plan:  Strep  pharyngitis Assessment & Plan: Positive strep test today. Negative for influenza and COVID-19.  Start amoxicillin  500 mg twice daily x 10 days. Continue Tylenol/Excedrin as needed.  Work note provided.  Orders: -     Amoxicillin ; Take 1 capsule (500 mg total) by mouth 2 (two) times daily for 10 days.  Dispense: 20 capsule; Refill: 0  Fever, unspecified fever cause -     POC COVID-19 BinaxNow -     POCT Influenza A/B  Sore throat -     POCT rapid strep A    Assessment and Plan Assessment & Plan         Comer MARLA Gaskins, NP  History of Present Illness

## 2024-03-01 NOTE — Assessment & Plan Note (Signed)
 Positive strep test today. Negative for influenza and COVID-19.  Start amoxicillin  500 mg twice daily x 10 days. Continue Tylenol/Excedrin as needed.  Work note provided.
